# Patient Record
Sex: Male | Born: 1955 | Race: White | Hispanic: No | Marital: Married | Smoking: Never smoker
Health system: Southern US, Community
[De-identification: ages and names within clinical notes are randomized; demographics above are authoritative.]

## PROBLEM LIST (undated history)

## (undated) DIAGNOSIS — I517 Cardiomegaly: Secondary | ICD-10-CM

## (undated) DIAGNOSIS — I4891 Unspecified atrial fibrillation: Secondary | ICD-10-CM

## (undated) DIAGNOSIS — G4730 Sleep apnea, unspecified: Secondary | ICD-10-CM

## (undated) DIAGNOSIS — I10 Essential (primary) hypertension: Secondary | ICD-10-CM

## (undated) DIAGNOSIS — R55 Syncope and collapse: Secondary | ICD-10-CM

## (undated) HISTORY — DX: Syncope and collapse: R55

## (undated) HISTORY — PX: TONSILLECTOMY: SUR1361

## (undated) HISTORY — DX: Sleep apnea, unspecified: G47.30

## (undated) HISTORY — DX: Cardiomegaly: I51.7

## (undated) HISTORY — PX: BACK SURGERY: SHX140

## (undated) HISTORY — DX: Unspecified atrial fibrillation: I48.91

---

## 2009-10-22 ENCOUNTER — Emergency Department: Payer: Self-pay | Admitting: Emergency Medicine

## 2011-04-29 IMAGING — CR DG KNEE COMPLETE 4+V*R*
1 series · 4 of 4 positions shown · non-contrast
Comparison: none

REASON FOR EXAM: R knee trauma
COMMENTS:

PROCEDURE:     DXR - DXR KNEE RT COMP WITH OBLIQUES  - October 22, 2009 [DATE]
RESULT:     Images of the right knee show no definite fracture, dislocation
or radiopaque foreign body.

[Series 1: view not recorded · 0.17mm/px · 4 of 4 slices shown]
[im 1/4]
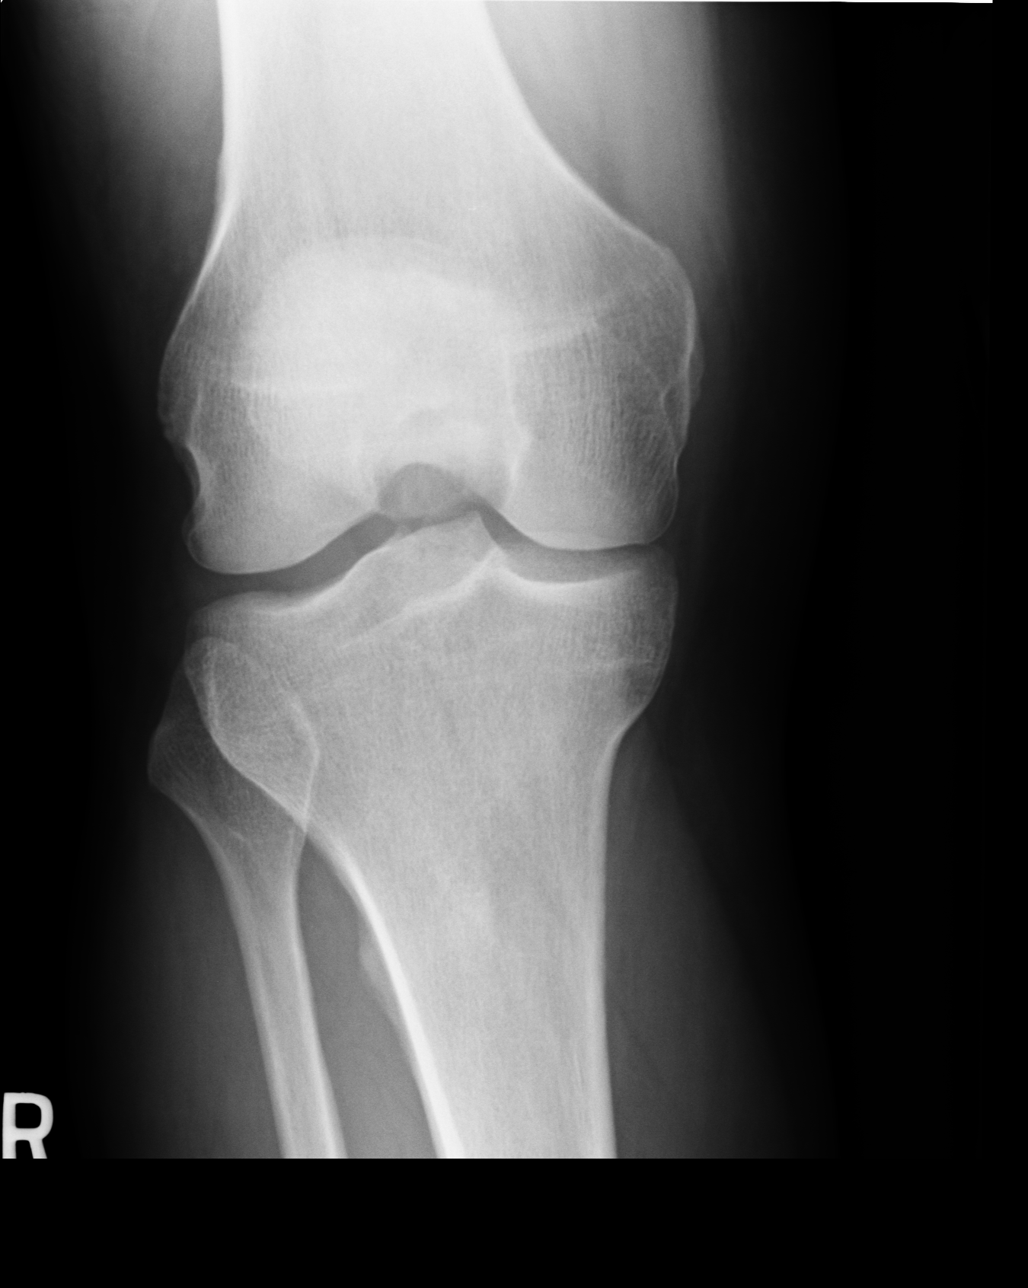
[im 2/4]
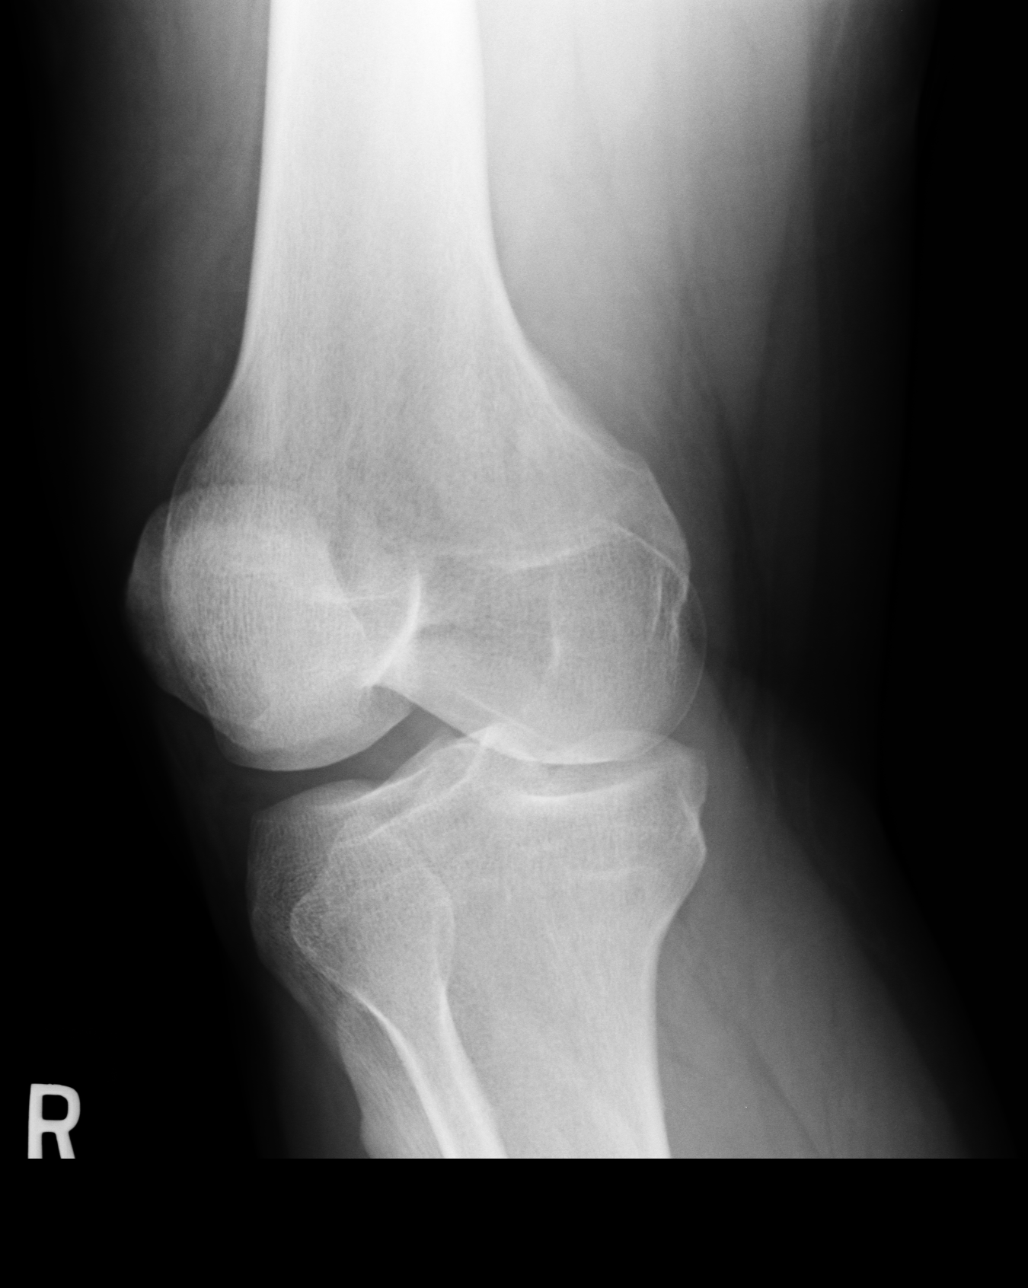
[im 3/4]
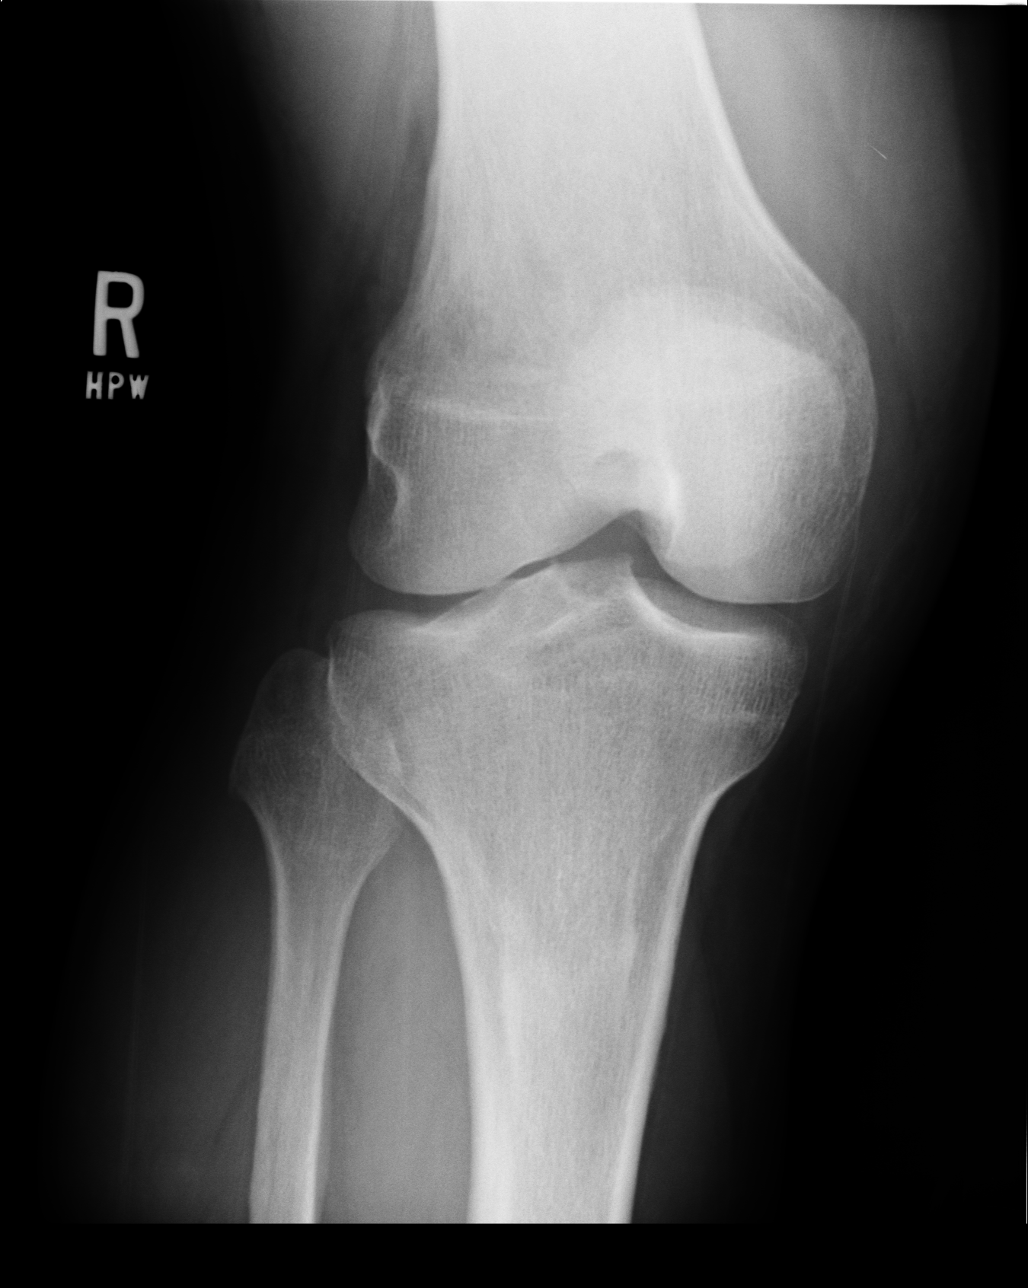
[im 4/4]
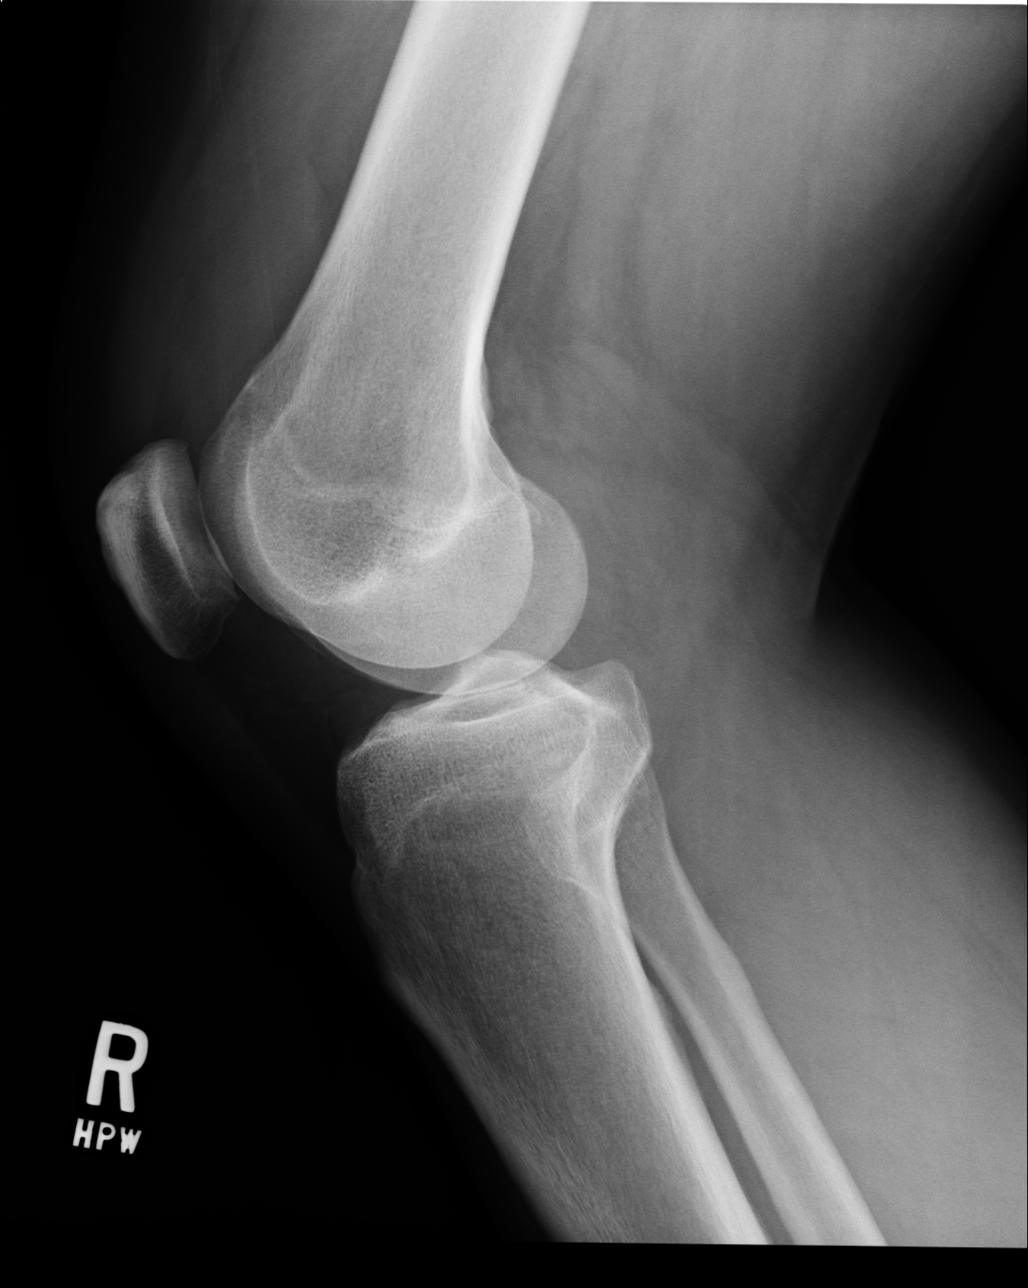

[4 of 4 positions shown; findings below may reference images not displayed]

IMPRESSION: Please see above.

## 2012-10-27 ENCOUNTER — Encounter: Payer: Self-pay | Admitting: Internal Medicine

## 2012-11-24 ENCOUNTER — Encounter: Payer: Self-pay | Admitting: Internal Medicine

## 2013-09-16 ENCOUNTER — Ambulatory Visit: Payer: Self-pay | Admitting: Family Medicine

## 2013-10-21 ENCOUNTER — Ambulatory Visit: Payer: Self-pay | Admitting: Family Medicine

## 2014-06-02 DIAGNOSIS — J302 Other seasonal allergic rhinitis: Secondary | ICD-10-CM | POA: Insufficient documentation

## 2015-03-24 ENCOUNTER — Encounter: Payer: Self-pay | Admitting: *Deleted

## 2015-03-27 ENCOUNTER — Ambulatory Visit
Admission: RE | Admit: 2015-03-27 | Discharge: 2015-03-27 | Disposition: A | Discharge status: Home / Self Care | Payer: BC Managed Care – PPO | Source: Ambulatory Visit | Attending: Gastroenterology | Admitting: Gastroenterology

## 2015-03-27 ENCOUNTER — Encounter
Admission: RE | Disposition: A | Discharge status: Home / Self Care | Payer: Self-pay | Source: Ambulatory Visit | Attending: Gastroenterology

## 2015-03-27 ENCOUNTER — Ambulatory Visit: Payer: BC Managed Care – PPO | Admitting: *Deleted

## 2015-03-27 DIAGNOSIS — E669 Obesity, unspecified: Secondary | ICD-10-CM | POA: Diagnosis not present

## 2015-03-27 DIAGNOSIS — D125 Benign neoplasm of sigmoid colon: Secondary | ICD-10-CM | POA: Insufficient documentation

## 2015-03-27 DIAGNOSIS — K621 Rectal polyp: Secondary | ICD-10-CM | POA: Insufficient documentation

## 2015-03-27 DIAGNOSIS — K5730 Diverticulosis of large intestine without perforation or abscess without bleeding: Secondary | ICD-10-CM | POA: Diagnosis not present

## 2015-03-27 DIAGNOSIS — G4730 Sleep apnea, unspecified: Secondary | ICD-10-CM | POA: Insufficient documentation

## 2015-03-27 DIAGNOSIS — Z1211 Encounter for screening for malignant neoplasm of colon: Secondary | ICD-10-CM | POA: Insufficient documentation

## 2015-03-27 DIAGNOSIS — Z79899 Other long term (current) drug therapy: Secondary | ICD-10-CM | POA: Insufficient documentation

## 2015-03-27 DIAGNOSIS — D120 Benign neoplasm of cecum: Secondary | ICD-10-CM | POA: Insufficient documentation

## 2015-03-27 DIAGNOSIS — Z6834 Body mass index (BMI) 34.0-34.9, adult: Secondary | ICD-10-CM | POA: Diagnosis not present

## 2015-03-27 DIAGNOSIS — I10 Essential (primary) hypertension: Secondary | ICD-10-CM | POA: Insufficient documentation

## 2015-03-27 HISTORY — PX: COLONOSCOPY WITH PROPOFOL: SHX5780

## 2015-03-27 HISTORY — DX: Essential (primary) hypertension: I10

## 2015-03-27 SURGERY — COLONOSCOPY WITH PROPOFOL
Anesthesia: General

## 2015-03-27 MED ORDER — LIDOCAINE HCL (CARDIAC) 20 MG/ML IV SOLN
INTRAVENOUS | Status: DC | PRN
  Administered 2015-03-27: 60 mg via INTRAVENOUS

## 2015-03-27 MED ORDER — PROPOFOL 500 MG/50ML IV EMUL
INTRAVENOUS | Status: DC | PRN
  Administered 2015-03-27: 130 ug/kg/min via INTRAVENOUS

## 2015-03-27 MED ORDER — SODIUM CHLORIDE 0.9 % IV SOLN
INTRAVENOUS | Status: DC
  Administered 2015-03-27: 1000 mL via INTRAVENOUS

## 2015-03-27 MED ORDER — PROPOFOL 10 MG/ML IV BOLUS
INTRAVENOUS | Status: DC | PRN
  Administered 2015-03-27: 40 mg via INTRAVENOUS

## 2015-03-27 NOTE — Discharge Instructions (Signed)

## 2015-03-27 NOTE — Transfer of Care (Signed)
Immediate Anesthesia Transfer of Care Note  Patient: Christopher Elliott.  Procedure(s) Performed: Procedure(s): COLONOSCOPY WITH PROPOFOL (N/A)  Patient Location: PACU  Anesthesia Type:General  Level of Consciousness: awake, alert , oriented and patient cooperative  Airway & Oxygen Therapy: Patient Spontanous Breathing and Patient connected to nasal cannula oxygen  Post-op Assessment: Report given to RN, Post -op Vital signs reviewed and stable and Patient moving all extremities X 4  Post vital signs: Reviewed and stable  Last Vitals:  Filed Vitals:   03/27/15 0741  Pulse: 76  Temp: 37 C  Resp: 20    Complications: No apparent anesthesia complications

## 2015-03-27 NOTE — Anesthesia Postprocedure Evaluation (Signed)
  Anesthesia Post-op Note  Patient: Christopher Elliott.  Procedure(s) Performed: Procedure(s): COLONOSCOPY WITH PROPOFOL (N/A)  Anesthesia type:General  Patient location: PACU  Post pain: Pain level controlled  Post assessment: Post-op Vital signs reviewed, Patient's Cardiovascular Status Stable, Respiratory Function Stable, Patent Airway and No signs of Nausea or vomiting  Post vital signs: Reviewed and stable  Last Vitals:  Filed Vitals:   03/27/15 0841  BP: 117/68  Pulse: 84  Temp: 36.1 C  Resp: 16    Level of consciousness: awake, alert  and patient cooperative  Complications: No apparent anesthesia complications

## 2015-03-27 NOTE — H&P (Signed)
  Primary Care Physician:  Marcello Fennel, MD  Pre-Procedure History & Physical: HPI:  Christopher Elliott. is a 59 y.o. male is here for an colonoscopy.   Past Medical History  Diagnosis Date  . Hypertension     Past Surgical History  Procedure Laterality Date  . Tonsillectomy    . Back surgery      Prior to Admission medications   Medication Sig Start Date End Date Taking? Authorizing Provider  amLODipine (NORVASC) 5 MG tablet Take 5 mg by mouth daily.   Yes Historical Provider, MD  losartan-hydrochlorothiazide (HYZAAR) 100-25 MG tablet Take 1 tablet by mouth daily.   Yes Historical Provider, MD  olmesartan-hydrochlorothiazide (BENICAR HCT) 40-25 MG tablet Take 1 tablet by mouth daily.    Historical Provider, MD    Allergies as of 02/14/2015  . (Not on File)    No family history on file.  Social History   Social History  . Marital Status: Married    Spouse Name: N/A  . Number of Children: N/A  . Years of Education: N/A   Occupational History  . Not on file.   Social History Main Topics  . Smoking status: Not on file  . Smokeless tobacco: Not on file  . Alcohol Use: Not on file  . Drug Use: Not on file  . Sexual Activity: Not on file   Other Topics Concern  . Not on file   Social History Narrative  . No narrative on file     Physical Exam: Pulse 76  Temp(Src) 98.6 F (37 C) (Oral)  Resp 20  Ht 5' 10.5" (1.791 m)  Wt 111.131 kg (245 lb)  BMI 34.65 kg/m2 General:   Alert,  pleasant and cooperative in NAD Head:  Normocephalic and atraumatic. Neck:  Supple; no masses or thyromegaly. Lungs:  Clear throughout to auscultation.    Heart:  Regular rate and rhythm. Abdomen:  Soft, nontender and nondistended. Normal bowel sounds, without guarding, and without rebound.   Neurologic:  Alert and  oriented x4;  grossly normal neurologically.  Impression/Plan: Kem Kays. is here for an colonoscopy to be performed for screening  Risks, benefits,  limitations, and alternatives regarding  colonoscopy have been reviewed with the patient.  Questions have been answered.  All parties agreeable.   Josefine Class, MD  03/27/2015, 8:09 AM

## 2015-03-27 NOTE — Op Note (Signed)
[facility] Gastroenterology Patient Name: Christopher Elliott Procedure Date: 03/27/2015 8:09 AM MRN: [id number] Account #: [id number] Date of Birth: Oct 22, 1955 Admit Type: Outpatient Age: 59 Room: [facility] ENDO ROOM 2 Gender: Male Note Status: Finalized Procedure:         Colonoscopy Indications:       Screening for colorectal malignant neoplasm, This is the                     patient's first colonoscopy Patient Profile:   This is a 59 year old male. Providers:         Gerrit Heck. Rayann Heman, MD Referring MD:      Caprice Renshaw (Referring MD) Medicines:         Propofol per Anesthesia Complications:     No immediate complications. Procedure:         Pre-Anesthesia Assessment:                    - Prior to the procedure, a History and Physical was                     performed, and patient medications, allergies and                     sensitivities were reviewed. The patient's tolerance of                     previous anesthesia was reviewed.                    After obtaining informed consent, the colonoscope was                     passed under direct vision. Throughout the procedure, the                     patient's blood pressure, pulse, and oxygen saturations                     were monitored continuously. The Colonoscope was                     introduced through the anus and advanced to the the                     terminal ileum. The colonoscopy was performed without                     difficulty. The patient tolerated the procedure well. The                     quality of the bowel preparation was excellent. Findings:      The perianal and digital rectal examinations were normal.      A 5 mm polyp was found in the cecum. The polyp was sessile. The polyp       was removed with a cold snare. Resection and retrieval were complete.      Four sessile polyps were found in the rectum and in the sigmoid colon.       The polyps were 2 to 3 mm in size. These polyps were  removed with a       jumbo cold forceps. Resection and retrieval were complete.      A few small-mouthed diverticula were found in the sigmoid colon.  The exam was otherwise without abnormality on direct and retroflexion       views. Impression:        - One 5 mm polyp in the cecum. Resected and retrieved.                    - Four 2 to 3 mm polyps in the rectum and in the sigmoid                     colon. Resected and retrieved.                    - Diverticulosis in the sigmoid colon.                    - The examination was otherwise normal on direct and                     retroflexion views. Recommendation:    - Observe patient in GI recovery unit.                    - High fiber diet.                    - Continue present medications.                    - Await pathology results.                    - Repeat colonoscopy for surveillance based on pathology                     results.                    - Return to referring physician.                    - The findings and recommendations were discussed with the                     patient.                    - The findings and recommendations were discussed with the                     patient's family. Procedure Code(s): --- Professional ---                    [phone], Colonoscopy, flexible; with removal of tumor(s),                     polyp(s), or other lesion(s) by snare technique                    60454, 39, Colonoscopy, flexible; with biopsy, single or                     multiple CPT copyright 2014 American Medical Association. All rights reserved. The codes documented in this report are preliminary and upon coder review may  be revised to meet current compliance requirements. Mellody Life, MD 03/27/2015 8:37:17 AM This report has been signed electronically. Number of Addenda: 0 Note Initiated On: 03/27/2015 8:09 AM Scope Withdrawal Time: 0 hours 13 minutes 37 seconds  Total Procedure Duration: 0 hours 16 minutes  21 seconds  [facility]

## 2015-03-27 NOTE — Anesthesia Preprocedure Evaluation (Signed)
Anesthesia Evaluation  Patient identified by MRN, date of birth, ID band Patient awake    Reviewed: Allergy & Precautions, NPO status , Patient's Chart, lab work & pertinent test results  Airway Mallampati: III  TM Distance: >3 FB Neck ROM: Limited    Dental  (+) Teeth Intact   Pulmonary sleep apnea and Continuous Positive Airway Pressure Ventilation ,    Pulmonary exam normal        Cardiovascular Exercise Tolerance: Good hypertension, Pt. on medications Normal cardiovascular exam     Neuro/Psych    GI/Hepatic negative GI ROS,   Endo/Other  negative endocrine ROS  Renal/GU      Musculoskeletal   Abdominal (+) + obese,   Peds  Hematology   Anesthesia Other Findings   Reproductive/Obstetrics                             Anesthesia Physical Anesthesia Plan  ASA: III  Anesthesia Plan: General   Post-op Pain Management:    Induction: Intravenous  Airway Management Planned: Nasal Cannula  Additional Equipment:   Intra-op Plan:   Post-operative Plan:   Informed Consent: I have reviewed the patients History and Physical, chart, labs and discussed the procedure including the risks, benefits and alternatives for the proposed anesthesia with the patient or authorized representative who has indicated his/her understanding and acceptance.     Plan Discussed with: CRNA  Anesthesia Plan Comments:         Anesthesia Quick Evaluation

## 2015-03-28 ENCOUNTER — Encounter: Payer: Self-pay | Admitting: Gastroenterology

## 2015-03-28 LAB — SURGICAL PATHOLOGY

## 2018-02-12 ENCOUNTER — Encounter (INDEPENDENT_AMBULATORY_CARE_PROVIDER_SITE_OTHER): Payer: Self-pay

## 2018-02-12 ENCOUNTER — Ambulatory Visit: Payer: BC Managed Care – PPO | Admitting: Family Medicine

## 2018-02-12 ENCOUNTER — Encounter: Payer: Self-pay | Admitting: Family Medicine

## 2018-02-12 VITALS — BP 110/70 | HR 69 | Temp 97.8°F | Ht 68.75 in | Wt 243.0 lb

## 2018-02-12 DIAGNOSIS — R55 Syncope and collapse: Secondary | ICD-10-CM | POA: Diagnosis not present

## 2018-02-12 DIAGNOSIS — I10 Essential (primary) hypertension: Secondary | ICD-10-CM | POA: Diagnosis not present

## 2018-02-12 DIAGNOSIS — I517 Cardiomegaly: Secondary | ICD-10-CM

## 2018-02-12 DIAGNOSIS — Z23 Encounter for immunization: Secondary | ICD-10-CM | POA: Diagnosis not present

## 2018-02-12 DIAGNOSIS — Z0000 Encounter for general adult medical examination without abnormal findings: Secondary | ICD-10-CM

## 2018-02-12 MED ORDER — OLMESARTAN MEDOXOMIL-HCTZ 40-25 MG PO TABS: 0.5000 | ORAL_TABLET | Freq: Every day | ORAL | Status: DC

## 2018-02-12 NOTE — Progress Notes (Signed)
New patient.   Hypertension:    Using medication without problems or lightheadedness:  See below.  Chest pain with exertion:no Edema:no Short of breath:no Weight up was up to 260 and then he made sig change in diet and exercise.     Nosebleed this AM, single event.  No other bleeding.    Syncope about 1 year ago.  He got up and then passed out.   Then another episode in the meantime, brief episode with standing.  He was out in the heat when it happened.  3 episodes total in the lifetime, all in the last few years.  He can occ get lightheaded.   Echo recently done NORMAL LEFT VENTRICULAR SYSTOLIC FUNCTION WITH MODERATE LVH NORMAL LA PRESSURES WITH NORMAL DIASTOLIC FUNCTION NORMAL RIGHT VENTRICULAR SYSTOLIC FUNCTION VALVULAR REGURGITATION: TRIVIAL MR, TRIVIAL TR NO VALVULAR STENOSIS NO PRIOR STUDY FOR COMPARISON  No h/o SZ activity.    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   Advance directive d/w pt.  Wife designated if patient were incapacitated.    PMH and SH reviewed  ROS: Per HPI unless specifically indicated in ROS section   Meds, vitals, and allergies reviewed.   GEN: nad, alert and oriented HEENT: mucous membranes moist NECK: supple w/o LA CV: rrr.  no murmur PULM: ctab, no inc wob ABD: soft, +bs EXT: no edema SKIN: no acute rash

## 2018-02-12 NOTE — Patient Instructions (Addendum)
Take tylenol instead of aleve or ibuprofen if needed.  Cut the BP medicine in half and update me about your BP in about 10 days.   Keep working on diet and exercise.   Recheck labs in about 2-3 months.  Fasting lab visit.  Take care.  Glad to see you.  Thanks for getting a flu shot.  I'll check back on your old records in the meantime.

## 2018-02-15 ENCOUNTER — Encounter: Payer: Self-pay | Admitting: Family Medicine

## 2018-02-15 ENCOUNTER — Telehealth: Payer: Self-pay | Admitting: Family Medicine

## 2018-02-15 DIAGNOSIS — Z114 Encounter for screening for human immunodeficiency virus [HIV]: Secondary | ICD-10-CM

## 2018-02-15 DIAGNOSIS — R55 Syncope and collapse: Secondary | ICD-10-CM | POA: Insufficient documentation

## 2018-02-15 DIAGNOSIS — Z1159 Encounter for screening for other viral diseases: Secondary | ICD-10-CM

## 2018-02-15 DIAGNOSIS — I10 Essential (primary) hypertension: Secondary | ICD-10-CM | POA: Insufficient documentation

## 2018-02-15 DIAGNOSIS — Z0000 Encounter for general adult medical examination without abnormal findings: Secondary | ICD-10-CM | POA: Insufficient documentation

## 2018-02-15 DIAGNOSIS — I517 Cardiomegaly: Secondary | ICD-10-CM | POA: Insufficient documentation

## 2018-02-15 NOTE — Assessment & Plan Note (Signed)
See syncope discussion.  Would cut olmesartan hydrochlorothiazide in half given the significant weight reduction and diet changes noted.  Recheck labs in a few months.  Echo discussed with patient.  He agrees with plan.  At this point goal is to avoid hypotension.  >30 minutes spent in face to face time with patient, >50% spent in counselling or coordination of care

## 2018-02-15 NOTE — Assessment & Plan Note (Signed)
Will review old records.  See follow-up phone note.

## 2018-02-15 NOTE — Assessment & Plan Note (Signed)
LVH noted, moderate.  Discussed with patient about adequate blood pressure control.  He is working on weight reduction.  Continue healthy diet and exercise.

## 2018-02-15 NOTE — Telephone Encounter (Signed)
Call patient.  Old records reviewed.  I do not see recent tetanus shot.  Would be reasonable to get done at some point, unless he knows of a dose in the last 10 years.  I do not see where he has had hepatitis C or HIV screening done in the past.  I do not think we talked about that at the office visit.  If he wants to get either done then we can add it on to his follow-up labs.  Let me know if I need to add that on.     I looked back at his colonoscopy report from 2016 and he had an adenomatous polyp so I believe he would need follow-up colonoscopy in 5 years, not 10.  EMR updated.  Keep working on diet and exercise and let me know how he feels and how his blood pressure is running on the lower dose of olmesartan hydrochlorothiazide as we discussed.  Thanks.

## 2018-02-15 NOTE — Assessment & Plan Note (Signed)
Has seen cardiology, has prodrome of symptoms.  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  When he has an event, it sounds like he has orthostatic symptoms contributing to the event.  He is working on diet and exercise.  Cut olmesartan hydrochlorothiazide in half as the goal is to avoid hypotension.  Per cardiology report, it was not thought that he had an arrhythmic issue.  He is never had a seizure before.  He agrees with plan.

## 2018-02-16 NOTE — Telephone Encounter (Signed)
Noted. Thanks.  HIV and HCV labs ordered.

## 2018-02-16 NOTE — Addendum Note (Signed)
Addended by: Tonia Ghent on: 02/16/2018 09:50 PM   Modules accepted: Orders

## 2018-02-16 NOTE — Telephone Encounter (Signed)
Patient notified as instructed by telephone and verbalized understanding. Patient stated that he does not remember getting a tetanus in the last 10 years and can get that in the future. Patient stated that he would like to have the hepatitis C and HIV lab work ordered with his next labs. Patient stated that he appreciated your good care at his office visit and your follow-up with him.

## 2018-04-28 ENCOUNTER — Other Ambulatory Visit: Payer: BC Managed Care – PPO

## 2018-09-10 ENCOUNTER — Telehealth: Payer: Self-pay

## 2018-09-10 NOTE — Telephone Encounter (Signed)
Tried to call pt. We do not have losartan on his list. We have olmesartan-hctz. I need to clarify what he is taking before we do a refill.

## 2018-09-10 NOTE — Telephone Encounter (Signed)
Pt called back and scheduled virtual visit on 09/10/18 with Dr Damita Dunnings. Pt last seen 01/2018. See appt notes for additional info.

## 2018-09-10 NOTE — Telephone Encounter (Signed)
[facility] Patient Name: Christopher Elliott Gender: Male DOB: [birth year] Age: 63 Y 9 M 22 D Return Phone Number: [phone] (Primary) Address: City/State/Zip: [place] [address] Client [facility] Night - Client Client Site [place] Physician [facility] - PHYSICIAN, NOT LISTED- MD Contact Type Call Who Is Calling Patient / Member / Family / Caregiver Call Type Triage / Clinical Relationship To Patient Self Return Phone Number [phone] (Primary) Chief Complaint Prescription Refill or Medication Request (non symptomatic) Reason for Call Medication Question / Request Initial Comment Pharm has not heard back from office about his refill. Dr Elsie Stain Translation No Nurse Assessment Nurse: Garnetta Buddy, RN, Cyndi Date/Time Eilene Ghazi Time): 09/09/2018 6:07:32 PM Confirm and document reason for call. If symptomatic, describe symptoms. ---Caller states he has been trying to get refill on his BP medication, Losartan, for 2 weeks. He states he called the pharmacy again today and patient was informed that doctor has not authorized refill "for whatever reason". Caller states he still has a few pills left. Caller reports no new or worsening symptoms. Caller advised to call doctor's office back in the morning during regular business hours per medication directives. Has the patient had close contact with a person known or suspected to have the novel coronavirus illness OR traveled / lives in area with major community spread (including international travel) in the last 14 days from the onset of symptoms? * If Asymptomatic, screen for exposure and travel within the last 14 days. ---Not Applicable Does the patient have any new or worsening symptoms? ---No Guidelines Guideline Title Affirmed Question Affirmed Notes Nurse Date/Time (Eastern Time) Disp. Time  Eilene Ghazi Time) Disposition Final User 09/09/2018 6:10:32 PM Clinical Call Yes Garnetta Buddy, RN, Cyndi

## 2018-09-11 ENCOUNTER — Ambulatory Visit (INDEPENDENT_AMBULATORY_CARE_PROVIDER_SITE_OTHER): Payer: BC Managed Care – PPO | Admitting: Family Medicine

## 2018-09-11 DIAGNOSIS — I10 Essential (primary) hypertension: Secondary | ICD-10-CM

## 2018-09-11 MED ORDER — OLMESARTAN MEDOXOMIL-HCTZ 40-25 MG PO TABS: 0.5000 | ORAL_TABLET | Freq: Every day | ORAL | 3 refills | Status: DC

## 2018-09-11 NOTE — Telephone Encounter (Signed)
See OV note from today.

## 2018-09-11 NOTE — Progress Notes (Signed)
Interactive audio and video telecommunications were attempted between this provider and patient, however failed, due to patient having technical difficulties OR patient did not have access to video capability.  We continued and completed visit with audio only.   Virtual Visit via Telephone Note  I connected with patient on 09/11/18 at 11:50 AM by telephone and verified that I am speaking with the correct person using two identifiers.  Location of patient: at work  Location of MD: [facility] Name of referring provider (if blank then none associated): Names per persons and role in encounter:  MD: Earlyne Iba, Patient: name listed above.    I discussed the limitations, risks, security and privacy concerns of performing an evaluation and management service by telephone and the availability of in person appointments. I also discussed with the patient that there may be a patient responsible charge related to this service. The patient expressed understanding and agreed to proceed.  History of Present Illness:   Hypertension:    Using medication without problems or lightheadedness: yes Chest pain with exertion:no Edema:no Short of breath:no Average home BPs: not checked but encouraged, with him to update me if consistently >140/>90. Needs refill, done at visit.    Observations/Objective: nad Normal speech.    Assessment and Plan: HTN.  No emergent issues noted.  He'll check BP and update me if needed.  He'll come in for fasting lab visit 12:45 on 09/16/2018. Continue work on diet and exercise, d/w pt.  rx sent, continue as is for now.  He agrees with plan.   Follow Up Instructions: as above.    I discussed the assessment and treatment plan with the patient. The patient was provided an opportunity to ask questions and all were answered. The patient agreed with the plan and demonstrated an understanding of the instructions.   The patient was advised to call back or seek an  in-person evaluation if the symptoms worsen or if the condition fails to improve as anticipated.  I provided 7 minutes of non-face-to-face time during this encounter.  Elsie Stain, MD

## 2018-09-13 NOTE — Assessment & Plan Note (Signed)
No emergent issues noted.  He'll check BP and update me if needed.  He'll come in for fasting lab visit 12:45 on 09/16/2018. Continue work on diet and exercise, d/w pt.  rx sent, continue as is for now.  He agrees with plan.

## 2018-09-16 ENCOUNTER — Other Ambulatory Visit (INDEPENDENT_AMBULATORY_CARE_PROVIDER_SITE_OTHER): Payer: Self-pay

## 2018-09-16 DIAGNOSIS — Z1159 Encounter for screening for other viral diseases: Secondary | ICD-10-CM

## 2018-09-16 DIAGNOSIS — I10 Essential (primary) hypertension: Secondary | ICD-10-CM

## 2018-09-16 DIAGNOSIS — Z114 Encounter for screening for human immunodeficiency virus [HIV]: Secondary | ICD-10-CM

## 2018-09-16 LAB — COMPREHENSIVE METABOLIC PANEL
ALT: 25 U/L (ref 0–53)
AST: 19 U/L (ref 0–37)
Albumin: 4 g/dL (ref 3.5–5.2)
Alkaline Phosphatase: 61 U/L (ref 39–117)
BUN: 21 mg/dL (ref 6–23)
CO2: 26 mEq/L (ref 19–32)
Calcium: 9.2 mg/dL (ref 8.4–10.5)
Chloride: 102 mEq/L (ref 96–112)
Creatinine, Ser: 1.09 mg/dL (ref 0.40–1.50)
GFR: 67.93 mL/min (ref >60.00)
Glucose, Bld: 100 mg/dL — ABNORMAL HIGH (ref 70–99)
Potassium: 4 mEq/L (ref 3.5–5.1)
Sodium: 136 mEq/L (ref 135–145)
Total Bilirubin: 0.5 mg/dL (ref 0.2–1.2)
Total Protein: 7.2 g/dL (ref 6.0–8.3)

## 2018-09-16 LAB — LIPID PANEL
Cholesterol: 173 mg/dL (ref 0–200)
HDL: 38.2 mg/dL — ABNORMAL LOW (ref >39.00)
LDL Cholesterol: 109 mg/dL — ABNORMAL HIGH (ref 0–99)
NonHDL: 134.66
Total CHOL/HDL Ratio: 5
Triglycerides: 130 mg/dL (ref 0.0–149.0)
VLDL: 26 mg/dL (ref 0.0–40.0)

## 2018-09-17 LAB — HEPATITIS C ANTIBODY
Hepatitis C Ab: NONREACTIVE
SIGNAL TO CUT-OFF: 0.03 (ref <1.00)

## 2018-09-17 LAB — HIV ANTIBODY (ROUTINE TESTING W REFLEX): HIV 1&2 Ab, 4th Generation: NONREACTIVE

## 2018-09-21 ENCOUNTER — Encounter: Payer: Self-pay | Admitting: *Deleted

## 2019-06-16 ENCOUNTER — Telehealth: Payer: Self-pay

## 2019-06-16 NOTE — Telephone Encounter (Addendum)
Pt left v/m; pt requested cb about covid vaccine. I called pt and got v/m  And left v/m for pt to cb. Pt called back;  I advised pt of website [email or web address] to request the covid vaccine. Pt said he tried the [facility] and was advised they are giving to pts 66 Yrs old and older.  Pt will try the website and will cb if needed.

## 2019-10-13 ENCOUNTER — Other Ambulatory Visit: Payer: Self-pay

## 2019-10-13 ENCOUNTER — Telehealth: Payer: Self-pay

## 2019-10-13 MED ORDER — OLMESARTAN MEDOXOMIL-HCTZ 40-25 MG PO TABS: 0.5000 | ORAL_TABLET | Freq: Every day | ORAL | 0 refills | Status: DC

## 2019-10-13 NOTE — Telephone Encounter (Signed)
Pt left v/m requesting refill olmesartan-HCTZ; pt said pharmacy is waiting on refill response. Per DPR left v/m for pt will refill olmesartan-HCTZ 40-25 mg # 45 x 0 and requested pt to call for appt with Dr Damita Dunnings before med runs out; pt last seen 08/2018.  Refill done to [facility] [address].

## 2019-10-22 ENCOUNTER — Ambulatory Visit: Payer: Self-pay | Admitting: Family Medicine

## 2019-11-09 ENCOUNTER — Ambulatory Visit (INDEPENDENT_AMBULATORY_CARE_PROVIDER_SITE_OTHER): Payer: Medicare PPO | Admitting: Family Medicine

## 2019-11-09 ENCOUNTER — Encounter: Payer: Self-pay | Admitting: Family Medicine

## 2019-11-09 ENCOUNTER — Other Ambulatory Visit: Payer: Self-pay

## 2019-11-09 VITALS — BP 146/82 | HR 95 | Temp 97.8°F | Ht 69.0 in | Wt 261.1 lb

## 2019-11-09 DIAGNOSIS — Z7189 Other specified counseling: Secondary | ICD-10-CM

## 2019-11-09 DIAGNOSIS — Z0000 Encounter for general adult medical examination without abnormal findings: Secondary | ICD-10-CM

## 2019-11-09 DIAGNOSIS — R55 Syncope and collapse: Secondary | ICD-10-CM

## 2019-11-09 DIAGNOSIS — I10 Essential (primary) hypertension: Secondary | ICD-10-CM

## 2019-11-09 DIAGNOSIS — M533 Sacrococcygeal disorders, not elsewhere classified: Secondary | ICD-10-CM

## 2019-11-09 DIAGNOSIS — G4733 Obstructive sleep apnea (adult) (pediatric): Secondary | ICD-10-CM

## 2019-11-09 MED ORDER — OLMESARTAN MEDOXOMIL-HCTZ 40-25 MG PO TABS: 0.5000 | ORAL_TABLET | Freq: Every day | ORAL | 3 refills | Status: DC

## 2019-11-09 NOTE — Patient Instructions (Addendum)
Tdap/tetanus may be cheaper at the pharmacy.   Check with your insurance to see if they will cover the shingles and tetanus shot. Check on coverage for PNA 23/pneumovax.   Let me know if you have trouble getting scheduled for a colonoscopy this fall.    Go to the lab on the way out.   If you have mychart we'll likely use that to update you.    We'll call about seeing cardiology.  Keep using your CPAP.  Take care.  Glad to see you.

## 2019-11-09 NOTE — Progress Notes (Signed)
This visit occurred during the SARS-CoV-2 public health emergency.  Safety protocols were in place, including screening questions prior to the visit, additional usage of staff PPE, and extensive cleaning of exam room while observing appropriate contact time as indicated for disinfecting solutions.  His mother is in ALF with CHF.  D/w pt.    Hypertension:   LVH prev noted on echo.  D/w pt about BP control. Using medication without problems or lightheadedness: see below Chest pain with exertion:no Edema:no Short of breath:no benicar HCTZ.   Using CPAP nightly for OSA with relief.  Compliant.  Sleeping better with use.   He has two episodes of syncope in the last year.  With both episodes (on separate days) he was sitting down, stood up, then passed out.  No injury.  Came to shortly thereafter.  No h/o SZ, no CP with the events.  He had prev cardiac eval for syncope years ago.    He had some brief episodes of R jaw swelling that quickly resolved. No lip or tongue swelling.  This appears to be a separate issue.  No symptoms currently.  This could have been due to a salivary stone that subsequently resolved.  He some tailbone pain on sitting.  Midline pain.  He does a lot of driving and a lot of sitting.  He thought that could be related.  He had his covid vaccine.  D/w pt.  Flu d/w pt. Tetanus shot may be cheaper at pharmacy.   shingrix d/w pt.   PNA d/w pt.  See avs.   Wife designated if patient were incapacitated.   HIV and HCV prev done.   Nonsmoker, not due for AAA screening.   Colonoscopy 2016, d/w pt about 5 year f/u.   Prostate cancer screening and PSA options (with potential risks and benefits of testing vs not testing) were discussed along with recent recs/guidelines.  He declined testing PSA at this point.  Meds, vitals, and allergies reviewed.   PMH and SH reviewed  ROS: Per HPI unless specifically indicated in ROS section   GEN: nad, alert and oriented HEENT: ncat NECK:  supple w/o LA CV: rrr. PULM: ctab, no inc wob ABD: soft, +bs EXT: no edema SKIN: no acute rash  At least 30 minutes were devoted to patient care in this encounter (this can potentially include time spent reviewing the patient's file/history, interviewing and examining the patient, counseling/reviewing plan with patient, ordering referrals, ordering tests, reviewing relevant laboratory or x-ray data, and documenting the encounter).

## 2019-11-10 LAB — COMPREHENSIVE METABOLIC PANEL
ALT: 30 U/L (ref 0–53)
AST: 24 U/L (ref 0–37)
Albumin: 4.2 g/dL (ref 3.5–5.2)
Alkaline Phosphatase: 63 U/L (ref 39–117)
BUN: 22 mg/dL (ref 6–23)
CO2: 27 mEq/L (ref 19–32)
Calcium: 9.3 mg/dL (ref 8.4–10.5)
Chloride: 103 mEq/L (ref 96–112)
Creatinine, Ser: 1.1 mg/dL (ref 0.40–1.50)
GFR: 66.98 mL/min (ref >60.00)
Glucose, Bld: 90 mg/dL (ref 70–99)
Potassium: 4.2 mEq/L (ref 3.5–5.1)
Sodium: 139 mEq/L (ref 135–145)
Total Bilirubin: 0.5 mg/dL (ref 0.2–1.2)
Total Protein: 7.1 g/dL (ref 6.0–8.3)

## 2019-11-10 LAB — CBC WITH DIFFERENTIAL/PLATELET
Basophils Absolute: 0.1 10*3/uL (ref 0.0–0.1)
Basophils Relative: 1.1 % (ref 0.0–3.0)
Eosinophils Absolute: 0.2 10*3/uL (ref 0.0–0.7)
Eosinophils Relative: 2.1 % (ref 0.0–5.0)
HCT: 43.2 % (ref 39.0–52.0)
Hemoglobin: 14.6 g/dL (ref 13.0–17.0)
Lymphocytes Relative: 25.7 % (ref 12.0–46.0)
Lymphs Abs: 2.3 10*3/uL (ref 0.7–4.0)
MCHC: 33.9 g/dL (ref 30.0–36.0)
MCV: 90.6 fl (ref 78.0–100.0)
Monocytes Absolute: 0.8 10*3/uL (ref 0.1–1.0)
Monocytes Relative: 9.1 % (ref 3.0–12.0)
Neutro Abs: 5.6 10*3/uL (ref 1.4–7.7)
Neutrophils Relative %: 62 % (ref 43.0–77.0)
Platelets: 235 10*3/uL (ref 150.0–400.0)
RBC: 4.76 Mil/uL (ref 4.22–5.81)
RDW: 14.8 % (ref 11.5–15.5)
WBC: 9.1 10*3/uL (ref 4.0–10.5)

## 2019-11-10 LAB — LIPID PANEL
Cholesterol: 166 mg/dL (ref 0–200)
HDL: 39.8 mg/dL (ref >39.00)
LDL Cholesterol: 103 mg/dL — ABNORMAL HIGH (ref 0–99)
NonHDL: 126.44
Total CHOL/HDL Ratio: 4
Triglycerides: 115 mg/dL (ref 0.0–149.0)
VLDL: 23 mg/dL (ref 0.0–40.0)

## 2019-11-10 LAB — TSH: TSH: 1.79 u[IU]/mL (ref 0.35–4.50)

## 2019-11-14 DIAGNOSIS — G4733 Obstructive sleep apnea (adult) (pediatric): Secondary | ICD-10-CM | POA: Insufficient documentation

## 2019-11-14 DIAGNOSIS — M533 Sacrococcygeal disorders, not elsewhere classified: Secondary | ICD-10-CM | POA: Insufficient documentation

## 2019-11-14 DIAGNOSIS — Z7189 Other specified counseling: Secondary | ICD-10-CM | POA: Insufficient documentation

## 2019-11-14 NOTE — Assessment & Plan Note (Signed)
Refer prior to cardiology.  No change in meds at this point.

## 2019-11-14 NOTE — Assessment & Plan Note (Signed)
He had his covid vaccine.  D/w pt.  Flu d/w pt. Tetanus shot may be cheaper at pharmacy.   shingrix d/w pt.   PNA d/w pt.  See avs.   Wife designated if patient were incapacitated.   HIV and HCV prev done.   Nonsmoker, not due for AAA screening.   Colonoscopy 2016, d/w pt about 5 year f/u.   Prostate cancer screening and PSA options (with potential risks and benefits of testing vs not testing) were discussed along with recent recs/guidelines.  He declined testing PSA at this point.

## 2019-11-14 NOTE — Assessment & Plan Note (Signed)
Likely related to prolonged sitting, discussed.  He can update me if worse.

## 2019-11-14 NOTE — Assessment & Plan Note (Signed)
  Using CPAP nightly for OSA with relief.  Compliant.  Sleeping better with use.  Continue as is.

## 2019-11-14 NOTE — Assessment & Plan Note (Signed)
See notes on EKG.  Refer back to cardiology.  At this point still okay for outpatient follow-up.  His blood pressure is not low and is not orthostatic so we did not change his medications at this point.

## 2019-11-14 NOTE — Assessment & Plan Note (Signed)
Wife designated if patient were incapacitated.  

## 2019-11-15 ENCOUNTER — Telehealth: Payer: Self-pay

## 2019-11-15 NOTE — Telephone Encounter (Signed)
Patient aware of results and recommendations. °

## 2019-11-15 NOTE — Telephone Encounter (Signed)
-----   Message from Tonia Ghent, MD sent at 11/14/2019 10:16 PM EDT ----- Please notify patient.  Sugar kidney and liver tests are fine.  His lipids are reasonable for now, his thyroid test is normal and his blood counts are fine.  Be careful upon standing to make sure he does not get lightheaded and I want him to follow-up with cardiology.  Already put in the referral.  If he has changes in the meantime then please let us know.  Thanks.

## 2020-01-31 DIAGNOSIS — I4891 Unspecified atrial fibrillation: Secondary | ICD-10-CM | POA: Diagnosis not present

## 2020-01-31 DIAGNOSIS — R0602 Shortness of breath: Secondary | ICD-10-CM | POA: Diagnosis not present

## 2020-02-03 DIAGNOSIS — I10 Essential (primary) hypertension: Secondary | ICD-10-CM | POA: Diagnosis not present

## 2020-02-03 DIAGNOSIS — I480 Paroxysmal atrial fibrillation: Secondary | ICD-10-CM | POA: Diagnosis not present

## 2020-02-11 DIAGNOSIS — Z7901 Long term (current) use of anticoagulants: Secondary | ICD-10-CM | POA: Diagnosis not present

## 2020-02-11 DIAGNOSIS — G4733 Obstructive sleep apnea (adult) (pediatric): Secondary | ICD-10-CM | POA: Diagnosis not present

## 2020-02-11 DIAGNOSIS — Z7722 Contact with and (suspected) exposure to environmental tobacco smoke (acute) (chronic): Secondary | ICD-10-CM | POA: Diagnosis not present

## 2020-02-11 DIAGNOSIS — Z809 Family history of malignant neoplasm, unspecified: Secondary | ICD-10-CM | POA: Diagnosis not present

## 2020-02-11 DIAGNOSIS — I4891 Unspecified atrial fibrillation: Secondary | ICD-10-CM | POA: Diagnosis not present

## 2020-04-17 ENCOUNTER — Other Ambulatory Visit: Payer: Self-pay | Admitting: Family Medicine

## 2020-04-17 NOTE — Telephone Encounter (Signed)
Pharmacy requests refill on: Olmesartan Medox/HCTZ 40-25 mg   LAST REFILL: 01/20/2020 LAST OV: 11/09/2019 NEXT OV: Not Scheduled PHARMACY: [facility] [place], [place]

## 2020-05-01 DIAGNOSIS — G4733 Obstructive sleep apnea (adult) (pediatric): Secondary | ICD-10-CM | POA: Diagnosis not present

## 2020-06-08 DIAGNOSIS — I480 Paroxysmal atrial fibrillation: Secondary | ICD-10-CM | POA: Diagnosis not present

## 2020-06-08 DIAGNOSIS — I10 Essential (primary) hypertension: Secondary | ICD-10-CM | POA: Diagnosis not present

## 2020-06-08 DIAGNOSIS — Z7901 Long term (current) use of anticoagulants: Secondary | ICD-10-CM | POA: Diagnosis not present

## 2020-06-22 DIAGNOSIS — J209 Acute bronchitis, unspecified: Secondary | ICD-10-CM | POA: Diagnosis not present

## 2020-06-22 DIAGNOSIS — R059 Cough, unspecified: Secondary | ICD-10-CM | POA: Diagnosis not present

## 2020-08-01 DIAGNOSIS — Z7901 Long term (current) use of anticoagulants: Secondary | ICD-10-CM | POA: Diagnosis not present

## 2020-08-01 DIAGNOSIS — I10 Essential (primary) hypertension: Secondary | ICD-10-CM | POA: Diagnosis not present

## 2020-08-01 DIAGNOSIS — Z8249 Family history of ischemic heart disease and other diseases of the circulatory system: Secondary | ICD-10-CM | POA: Diagnosis not present

## 2020-08-01 DIAGNOSIS — I739 Peripheral vascular disease, unspecified: Secondary | ICD-10-CM | POA: Diagnosis not present

## 2020-10-02 ENCOUNTER — Telehealth: Payer: Self-pay

## 2020-10-02 NOTE — Telephone Encounter (Signed)
Patient needs AWV phone visit in June prior to his CPE visit on 11/10/20 with Dr Damita Dunnings. There were no openings yet for June for your schedule. Please call patient to schedule or let me know when I can schedule patient. His wife also needs same thing and I am sending a note on her to you also. Thank you!

## 2020-10-03 NOTE — Telephone Encounter (Signed)
Routing to [employer] for [employer], [employer].

## 2020-10-10 ENCOUNTER — Ambulatory Visit: Payer: Medicare PPO

## 2020-10-13 ENCOUNTER — Other Ambulatory Visit: Payer: Self-pay

## 2020-10-13 ENCOUNTER — Ambulatory Visit (INDEPENDENT_AMBULATORY_CARE_PROVIDER_SITE_OTHER): Payer: Medicare PPO

## 2020-10-13 DIAGNOSIS — Z0000 Encounter for general adult medical examination without abnormal findings: Secondary | ICD-10-CM

## 2020-10-13 NOTE — Progress Notes (Signed)
PCP notes:  Health Maintenance:  Prevnar 13, Shingrix and Covid- Up to date per patient. States he received at pharmacy and will bring the dates to his physical next month so we can document in chart  Colonoscopy- due   Abnormal Screenings: none   Patient concerns: none   Nurse concerns: none   Next PCP appt.: 11/10/2020 @ 12 pm

## 2020-10-13 NOTE — Patient Instructions (Signed)
Christopher Elliott , Thank you for taking time to come for your Medicare Wellness Visit. I appreciate your ongoing commitment to your health goals. Please review the following plan we discussed and let me know if I can assist you in the future.   Screening recommendations/referrals: Colonoscopy: due, will discuss with provider at physical Recommended yearly ophthalmology/optometry visit for glaucoma screening and checkup Recommended yearly dental visit for hygiene and checkup  Vaccinations: Influenza vaccine: due Fall 2022  Pneumococcal vaccine:Up to date per patient. Bring the dates to your physical next month so we can document in chart Tdap vaccine: due, will complete at pharmacy   Shingles vaccine: Up to date per patient. Bring the dates to your physical next month so we can document in chart   Covid-19: Up to date per patient. Bring the dates to your physical next month so we can document in chart  Advanced directives: Please bring a copy of your POA (Power of [place]) and/or Living Will to your next appointment.   Conditions/risks identified: hypertension   Next appointment: Follow up in one year for your annual wellness visit.   Preventive Care 67 Years and Older, Male Preventive care refers to lifestyle choices and visits with your health care provider that can promote health and wellness. What does preventive care include?  A yearly physical exam. This is also called an annual well check.  Dental exams once or twice a year.  Routine eye exams. Ask your health care provider how often you should have your eyes checked.  Personal lifestyle choices, including:  Daily care of your teeth and gums.  Regular physical activity.  Eating a healthy diet.  Avoiding tobacco and drug use.  Limiting alcohol use.  Practicing safe sex.  Taking low doses of aspirin every day.  Taking vitamin and mineral supplements as recommended by your health care provider. What happens during an annual  well check? The services and screenings done by your health care provider during your annual well check will depend on your age, overall health, lifestyle risk factors, and family history of disease. Counseling  Your health care provider may ask you questions about your:  Alcohol use.  Tobacco use.  Drug use.  Emotional well-being.  Home and relationship well-being.  Sexual activity.  Eating habits.  History of falls.  Memory and ability to understand (cognition).  Work and work Statistician. Screening  You may have the following tests or measurements:  Height, weight, and BMI.  Blood pressure.  Lipid and cholesterol levels. These may be checked every 5 years, or more frequently if you are over 30 years old.  Skin check.  Lung cancer screening. You may have this screening every year starting at age 42 if you have a 30-pack-year history of smoking and currently smoke or have quit within the past 15 years.  Fecal occult blood test (FOBT) of the stool. You may have this test every year starting at age 59.  Flexible sigmoidoscopy or colonoscopy. You may have a sigmoidoscopy every 5 years or a colonoscopy every 10 years starting at age 29.  Prostate cancer screening. Recommendations will vary depending on your family history and other risks.  Hepatitis C blood test.  Hepatitis B blood test.  Sexually transmitted disease (STD) testing.  Diabetes screening. This is done by checking your blood sugar (glucose) after you have not eaten for a while (fasting). You may have this done every 1-3 years.  Abdominal aortic aneurysm (AAA) screening. You may need this if you  are a current or former smoker.  Osteoporosis. You may be screened starting at age 91 if you are at high risk. Talk with your health care provider about your test results, treatment options, and if necessary, the need for more tests. Vaccines  Your health care provider may recommend certain vaccines, such  as:  Influenza vaccine. This is recommended every year.  Tetanus, diphtheria, and acellular pertussis (Tdap, Td) vaccine. You may need a Td booster every 10 years.  Zoster vaccine. You may need this after age 82.  Pneumococcal 13-valent conjugate (PCV13) vaccine. One dose is recommended after age 48.  Pneumococcal polysaccharide (PPSV23) vaccine. One dose is recommended after age 74. Talk to your health care provider about which screenings and vaccines you need and how often you need them. This information is not intended to replace advice given to you by your health care provider. Make sure you discuss any questions you have with your health care provider. Document Released: 06/09/2015 Document Revised: 01/31/2016 Document Reviewed: 03/14/2015 Elsevier Interactive Patient Education  2017 [place] Prevention in the Home Falls can cause injuries. They can happen to people of all ages. There are many things you can do to make your home safe and to help prevent falls. What can I do on the outside of my home?  Regularly fix the edges of walkways and driveways and fix any cracks.  Remove anything that might make you trip as you walk through a door, such as a raised step or threshold.  Trim any bushes or trees on the path to your home.  Use bright outdoor lighting.  Clear any walking paths of anything that might make someone trip, such as rocks or tools.  Regularly check to see if handrails are loose or broken. Make sure that both sides of any steps have handrails.  Any raised decks and porches should have guardrails on the edges.  Have any leaves, snow, or ice cleared regularly.  Use sand or salt on walking paths during winter.  Clean up any spills in your garage right away. This includes oil or grease spills. What can I do in the bathroom?  Use night lights.  Install grab bars by the toilet and in the tub and shower. Do not use towel bars as grab bars.  Use  non-skid mats or decals in the tub or shower.  If you need to sit down in the shower, use a plastic, non-slip stool.  Keep the floor dry. Clean up any water that spills on the floor as soon as it happens.  Remove soap buildup in the tub or shower regularly.  Attach bath mats securely with double-sided non-slip rug tape.  Do not have throw rugs and other things on the floor that can make you trip. What can I do in the bedroom?  Use night lights.  Make sure that you have a light by your bed that is easy to reach.  Do not use any sheets or blankets that are too big for your bed. They should not hang down onto the floor.  Have a firm chair that has side arms. You can use this for support while you get dressed.  Do not have throw rugs and other things on the floor that can make you trip. What can I do in the kitchen?  Clean up any spills right away.  Avoid walking on wet floors.  Keep items that you use a lot in easy-to-reach places.  If you need to reach  something above you, use a strong step stool that has a grab bar.  Keep electrical cords out of the way.  Do not use floor polish or wax that makes floors slippery. If you must use wax, use non-skid floor wax.  Do not have throw rugs and other things on the floor that can make you trip. What can I do with my stairs?  Do not leave any items on the stairs.  Make sure that there are handrails on both sides of the stairs and use them. Fix handrails that are broken or loose. Make sure that handrails are as long as the stairways.  Check any carpeting to make sure that it is firmly attached to the stairs. Fix any carpet that is loose or worn.  Avoid having throw rugs at the top or bottom of the stairs. If you do have throw rugs, attach them to the floor with carpet tape.  Make sure that you have a light switch at the top of the stairs and the bottom of the stairs. If you do not have them, ask someone to add them for you. What  else can I do to help prevent falls?  Wear shoes that:  Do not have high heels.  Have rubber bottoms.  Are comfortable and fit you well.  Are closed at the toe. Do not wear sandals.  If you use a stepladder:  Make sure that it is fully opened. Do not climb a closed stepladder.  Make sure that both sides of the stepladder are locked into place.  Ask someone to hold it for you, if possible.  Clearly mark and make sure that you can see:  Any grab bars or handrails.  First and last steps.  Where the edge of each step is.  Use tools that help you move around (mobility aids) if they are needed. These include:  Canes.  Walkers.  Scooters.  Crutches.  Turn on the lights when you go into a dark area. Replace any light bulbs as soon as they burn out.  Set up your furniture so you have a clear path. Avoid moving your furniture around.  If any of your floors are uneven, fix them.  If there are any pets around you, be aware of where they are.  Review your medicines with your doctor. Some medicines can make you feel dizzy. This can increase your chance of falling. Ask your doctor what other things that you can do to help prevent falls. This information is not intended to replace advice given to you by your health care provider. Make sure you discuss any questions you have with your health care provider. Document Released: 03/09/2009 Document Revised: 10/19/2015 Document Reviewed: 06/17/2014 Elsevier Interactive Patient Education  2017 [employer].

## 2020-10-13 NOTE — Progress Notes (Signed)
Subjective:   Christopher Elliott. is a 65 y.o. male who presents for Medicare Annual/Subsequent preventive examination.  Review of Systems: N/A     I connected with the patient today by telephone and verified that I am speaking with the correct person using two identifiers. Location patient: home Location nurse: work Persons participating in the telephone visit: patient, nurse.   I discussed the limitations, risks, security and privacy concerns of performing an evaluation and management service by telephone and the availability of in person appointments. I also discussed with the patient that there may be a patient responsible charge related to this service. The patient expressed understanding and verbally consented to this telephonic visit.        Cardiac Risk Factors include: advanced age (>84men, >23 women);male gender;hypertension     Objective:    Today's Vitals   There is no height or weight on file to calculate BMI.  Advanced Directives 10/13/2020 03/27/2015  Does Patient Have a Medical Advance Directive? Yes No  Type of Paramedic of [place];Living will -  Copy of [place] in Chart? No - copy requested -  Would patient like information on creating a medical advance directive? - No - patient declined information    Current Medications (verified) Outpatient Encounter Medications as of 10/13/2020  Medication Sig  . apixaban (ELIQUIS) 5 MG TABS tablet Take by mouth.  . diltiazem (CARDIZEM) 120 MG tablet Take by mouth.  . olmesartan-hydrochlorothiazide (BENICAR HCT) 40-25 MG tablet TAKE 1/2 TABLET BY MOUTH DAILY (Patient not taking: Reported on 10/13/2020)   No facility-administered encounter medications on file as of 10/13/2020.    Allergies (verified) Patient has no known allergies.   History: Past Medical History:  Diagnosis Date  . Hypertension   . LVH (left ventricular hypertrophy)    Noted on echo 2019.  Marland Kitchen Syncope     Past Surgical History:  Procedure Laterality Date  . BACK SURGERY    . COLONOSCOPY WITH PROPOFOL N/A 03/27/2015   Procedure: COLONOSCOPY WITH PROPOFOL;  Surgeon: Josefine Class, MD;  [facility] ENDOSCOPY;  Service: Endoscopy;  Laterality: N/A;  . TONSILLECTOMY     Family History  Problem Relation Age of Onset  . Diabetes Mother   . Lung cancer Father   . Colon cancer Neg Hx   . Prostate cancer Neg Hx    Social History   Socioeconomic History  . Marital status: Married    Spouse name: Not on file  . Number of children: Not on file  . Years of education: Not on file  . Highest education level: Not on file  Occupational History  . Not on file  Tobacco Use  . Smoking status: Never Smoker  . Smokeless tobacco: Former Network engineer  . Vaping Use: Never used  Substance and Sexual Activity  . Alcohol use: Yes    Comment: Occasional, on the weekends  . Drug use: Never  . Sexual activity: Yes  Other Topics Concern  . Not on file  Social History Narrative   Married 1976   3 kids, none local.  4 grandkids.   College graduate.     Works in Press photographer for [employer].  Some travel with work but usually home at night.   Grew up in [place].  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  No known concussions.   [place] [employer] fan.   Social Determinants of Health   Financial Resource Strain: Low  Risk   . Difficulty of Paying Living Expenses: Not hard at all  Food Insecurity: No Food Insecurity  . Worried About Programme researcher, broadcasting/film/video in the Last Year: Never true  . Ran Out of Food in the Last Year: Never true  Transportation Needs: No Transportation Needs  . Lack of Transportation (Medical): No  . Lack of Transportation (Non-Medical): No  Physical Activity: Inactive  . Days of Exercise per Week: 0 days  . Minutes of Exercise per Session: 0 min  Stress: No Stress Concern Present  . Feeling of Stress : Not at all  Social Connections: Not on file    Tobacco  Counseling Counseling given: Not Answered   Clinical Intake:  Pre-visit preparation completed: Yes  Pain : No/denies pain     Nutritional Risks: None Diabetes: No  How often do you need to have someone help you when you read instructions, pamphlets, or other written materials from your doctor or pharmacy?: 1 - Never What is the last grade level you completed in school?: college  Diabetic: No Nutrition Risk Assessment:  Has the patient had any N/V/D within the last 2 months?  No  Does the patient have any non-healing wounds?  No  Has the patient had any unintentional weight loss or weight gain?  No   Diabetes:  Is the patient diabetic?  No  If diabetic, was a CBG obtained today?  N/A Did the patient bring in their glucometer from home?  N/A How often do you monitor your CBG's? N/A.   Financial Strains and Diabetes Management:  Are you having any financial strains with the device, your supplies or your medication? N/A.  Does the patient want to be seen by Chronic Care Management for management of their diabetes?  N/A Would the patient like to be referred to a Nutritionist or for Diabetic Management?  N/A Interpreter Needed?: No  Information entered by :: CJohnson, LPN   Activities of Daily Living In your present state of health, do you have any difficulty performing the following activities: 10/13/2020  Hearing? N  Vision? N  Difficulty concentrating or making decisions? N  Walking or climbing stairs? N  Dressing or bathing? N  Doing errands, shopping? N  Preparing Food and eating ? N  Using the Toilet? N  In the past six months, have you accidently leaked urine? N  Do you have problems with loss of bowel control? N  Managing your Medications? N  Managing your Finances? N  Housekeeping or managing your Housekeeping? N  Some recent data might be hidden    Patient Care Team: Joaquim Nam, MD as PCP - General (Family Medicine)  Indicate any recent Medical  Services you may have received from other than [facility] providers in the past year (date may be approximate).     Assessment:   This is a routine wellness examination for [facility].  Hearing/Vision screen  Hearing Screening   125Hz  250Hz  500Hz  1000Hz  2000Hz  3000Hz  4000Hz  6000Hz  8000Hz   Right ear:           Left ear:           Vision Screening Comments: Patient gets annual eye exams   Dietary issues and exercise activities discussed: Current Exercise Habits: The patient does not participate in regular exercise at present, Exercise limited by: None identified  Goals Addressed            This Visit's Progress   . Patient Stated  10/13/2020, I will maintain and continue medications as prescribed.       Depression Screen PHQ 2/9 Scores 10/13/2020 11/09/2019  PHQ - 2 Score 0 0  PHQ- 9 Score 0 -    Fall Risk Fall Risk  10/13/2020 11/09/2019  Falls in the past year? 0 1  Number falls in past yr: 0 1  Injury with Fall? 0 -  Risk for fall due to : Medication side effect -  Follow up Falls evaluation completed;Falls prevention discussed -    FALL RISK PREVENTION PERTAINING TO THE HOME:  Any stairs in or around the home? Yes  If so, are there any without handrails? No  Home free of loose throw rugs in walkways, pet beds, electrical cords, etc? Yes  Adequate lighting in your home to reduce risk of falls? Yes   ASSISTIVE DEVICES UTILIZED TO PREVENT FALLS:  Life alert? No  Use of a cane, walker or w/c? No  Grab bars in the bathroom? No  Shower chair or bench in shower? No  Elevated toilet seat or a handicapped toilet? No   TIMED UP AND GO:  Was the test performed? N/A telephone visit .  Cognitive Function: MMSE - Mini Mental State Exam 10/13/2020  Orientation to time 5  Orientation to Place 5  Registration 3  Attention/ Calculation 5  Recall 3  Language- repeat 1       Mini Cog  Mini-Cog screen was completed. Maximum score is 22. A value of 0 denotes this part of the  MMSE was not completed or the patient failed this part of the Mini-Cog screening.  Immunizations Immunization History  Administered Date(s) Administered  . Influenza,inj,Quad PF,6+ Mos 02/12/2018  . Influenza-Unspecified 04/08/2017  . Moderna Sars-Covid-2 Vaccination 07/07/2019, 08/04/2019    TDAP status: Due, Education has been provided regarding the importance of this vaccine. Advised may receive this vaccine at local pharmacy or Health Dept. Aware to provide a copy of the vaccination record if obtained from local pharmacy or Health Dept. Verbalized acceptance and understanding.  Flu Vaccine status: due Fall 2022  Pneumococcal vaccine status: Up to date per patient. States he received at pharmacy and will bring the dates to his physical next month so we can document in chart  Covid-19 vaccine status: Up to date per patient. States he received at pharmacy and will bring the dates to his physical next month so we can document in chart   Qualifies for Shingles Vaccine? Yes   Zostavax completed No   Shingrix Completed: Up to date per patient. States he received at pharmacy and will bring the dates to his physical next month so we can document in chart  Screening Tests Health Maintenance  Topic Date Due  . TETANUS/TDAP  Never done  . PNA vac Low Risk Adult (1 of 2 - PCV13) Never done  . COVID-19 Vaccine (3 - Booster for Moderna series) 01/04/2020  . COLONOSCOPY (Pts 45-56yrs Insurance coverage will need to be confirmed)  03/26/2020  . INFLUENZA VACCINE  12/25/2020  . Hepatitis C Screening  Completed  . HPV VACCINES  Aged Out    Health Maintenance  Health Maintenance Due  Topic Date Due  . TETANUS/TDAP  Never done  . PNA vac Low Risk Adult (1 of 2 - PCV13) Never done  . COVID-19 Vaccine (3 - Booster for Moderna series) 01/04/2020  . COLONOSCOPY (Pts 45-41yrs Insurance coverage will need to be confirmed)  03/26/2020    Colorectal cancer screening: due, will discuss  with  provider at physical   Lung Cancer Screening: (Low Dose CT Chest recommended if Age 31-80 years, 30 pack-year currently smoking OR have quit w/in 15 years) does not qualify.    Additional Screening:  Hepatitis C Screening: does qualify; Completed 09/16/2018  Vision Screening: Recommended annual ophthalmology exams for early detection of glaucoma and other disorders of the eye. Is the patient up to date with their annual eye exam?  Yes  Who is the provider or what is the name of the office in which the patient attends annual eye exams? Eye doctor at [facility], [place] If pt is not established with a provider, would they like to be referred to a provider to establish care? No .   Dental Screening: Recommended annual dental exams for proper oral hygiene  Community Resource Referral / Chronic Care Management: CRR required this visit?  No   CCM required this visit?  No      Plan:     I have personally reviewed and noted the following in the patient's chart:   . Medical and social history . Use of alcohol, tobacco or illicit drugs  . Current medications and supplements including opioid prescriptions. Patient is not currently taking opioid prescriptions. . Functional ability and status . Nutritional status . Physical activity . Advanced directives . List of other physicians . Hospitalizations, surgeries, and ER visits in previous 12 months . Vitals . Screenings to include cognitive, depression, and falls . Referrals and appointments  In addition, I have reviewed and discussed with patient certain preventive protocols, quality metrics, and best practice recommendations. A written personalized care plan for preventive services as well as general preventive health recommendations were provided to patient.   Due to this being a telephonic visit, the after visit summary with patients personalized plan was offered to patient via office or my-chart. Patient preferred to pick up at office at  next visit or via mychart.   Andrez Grime, LPN   01/11/5630

## 2020-11-10 ENCOUNTER — Encounter: Payer: Self-pay | Admitting: Family Medicine

## 2020-11-10 ENCOUNTER — Other Ambulatory Visit: Payer: Self-pay

## 2020-11-10 ENCOUNTER — Ambulatory Visit (INDEPENDENT_AMBULATORY_CARE_PROVIDER_SITE_OTHER): Payer: Medicare PPO | Admitting: Family Medicine

## 2020-11-10 VITALS — BP 158/84 | HR 72 | Temp 97.2°F | Ht 69.0 in | Wt 254.0 lb

## 2020-11-10 DIAGNOSIS — I10 Essential (primary) hypertension: Secondary | ICD-10-CM | POA: Diagnosis not present

## 2020-11-10 DIAGNOSIS — G4733 Obstructive sleep apnea (adult) (pediatric): Secondary | ICD-10-CM | POA: Diagnosis not present

## 2020-11-10 DIAGNOSIS — Z1211 Encounter for screening for malignant neoplasm of colon: Secondary | ICD-10-CM

## 2020-11-10 DIAGNOSIS — I480 Paroxysmal atrial fibrillation: Secondary | ICD-10-CM | POA: Diagnosis not present

## 2020-11-10 DIAGNOSIS — Z0000 Encounter for general adult medical examination without abnormal findings: Secondary | ICD-10-CM

## 2020-11-10 DIAGNOSIS — Z7189 Other specified counseling: Secondary | ICD-10-CM

## 2020-11-10 LAB — COMPREHENSIVE METABOLIC PANEL
ALT: 32 U/L (ref 0–53)
AST: 20 U/L (ref 0–37)
Albumin: 4.2 g/dL (ref 3.5–5.2)
Alkaline Phosphatase: 93 U/L (ref 39–117)
BUN: 23 mg/dL (ref 6–23)
CO2: 28 mEq/L (ref 19–32)
Calcium: 9.8 mg/dL (ref 8.4–10.5)
Chloride: 104 mEq/L (ref 96–112)
Creatinine, Ser: 0.93 mg/dL (ref 0.40–1.50)
GFR: 85.28 mL/min (ref >60.00)
Glucose, Bld: 89 mg/dL (ref 70–99)
Potassium: 4.7 mEq/L (ref 3.5–5.1)
Sodium: 138 mEq/L (ref 135–145)
Total Bilirubin: 0.6 mg/dL (ref 0.2–1.2)
Total Protein: 7.4 g/dL (ref 6.0–8.3)

## 2020-11-10 LAB — CBC WITH DIFFERENTIAL/PLATELET
Basophils Absolute: 0.1 10*3/uL (ref 0.0–0.1)
Basophils Relative: 0.6 % (ref 0.0–3.0)
Eosinophils Absolute: 0.2 10*3/uL (ref 0.0–0.7)
Eosinophils Relative: 2.2 % (ref 0.0–5.0)
HCT: 49.4 % (ref 39.0–52.0)
Hemoglobin: 16.4 g/dL (ref 13.0–17.0)
Lymphocytes Relative: 35.4 % (ref 12.0–46.0)
Lymphs Abs: 3.1 10*3/uL (ref 0.7–4.0)
MCHC: 33.2 g/dL (ref 30.0–36.0)
MCV: 90 fl (ref 78.0–100.0)
Monocytes Absolute: 1 10*3/uL (ref 0.1–1.0)
Monocytes Relative: 11.4 % (ref 3.0–12.0)
Neutro Abs: 4.4 10*3/uL (ref 1.4–7.7)
Neutrophils Relative %: 50.4 % (ref 43.0–77.0)
Platelets: 229 10*3/uL (ref 150.0–400.0)
RBC: 5.48 Mil/uL (ref 4.22–5.81)
RDW: 15.3 % (ref 11.5–15.5)
WBC: 8.7 10*3/uL (ref 4.0–10.5)

## 2020-11-10 LAB — LIPID PANEL
Cholesterol: 207 mg/dL — ABNORMAL HIGH (ref 0–200)
HDL: 49.4 mg/dL (ref >39.00)
LDL Cholesterol: 117 mg/dL — ABNORMAL HIGH (ref 0–99)
NonHDL: 157.21
Total CHOL/HDL Ratio: 4
Triglycerides: 199 mg/dL — ABNORMAL HIGH (ref 0.0–149.0)
VLDL: 39.8 mg/dL (ref 0.0–40.0)

## 2020-11-10 LAB — TSH: TSH: 2.66 u[IU]/mL (ref 0.35–4.50)

## 2020-11-10 MED ORDER — DILTIAZEM HCL 120 MG PO TABS: 120.0000 mg | ORAL_TABLET | Freq: Every day | ORAL | Status: AC

## 2020-11-10 MED ORDER — APIXABAN 5 MG PO TABS: 5.0000 mg | ORAL_TABLET | Freq: Two times a day (BID) | ORAL | Status: AC

## 2020-11-10 NOTE — Patient Instructions (Signed)
Keep working on your weight and let me know if your BP is still persistently above 140/90.  Go to the lab on the way out.   If you have mychart we'll likely use that to update you.    Take care.  Glad to see you. We'll call about seeing GI at [facility].

## 2020-11-10 NOTE — Progress Notes (Signed)
This visit occurred during the SARS-CoV-2 public health emergency.  Safety protocols were in place, including screening questions prior to the visit, additional usage of staff PPE, and extensive cleaning of exam room while observing appropriate contact time as indicated for disinfecting solutions.  PAF/HTN. He hasn't been known to be in A fib recently.   Using medication without problems or lightheadedness: yes Chest pain with exertion:no Edema:no Short of breath:no Labs d/w pt.   Rationale for current medications discussed with patient.  OSA on CPAP.  Sleeping well with use.  Waking with more energy.  Compliant.  He had his covid vaccine.  D/w pt. Flu prev done.   Tetanus 2021 Shingrix prev done.    PNA 2021 Wife designated if patient were incapacitated.   HIV and HCV prev done.   Nonsmoker, not due for AAA screening.   Colonoscopy 2016, referral placed.   Prostate cancer screening and PSA options (with potential risks and benefits of testing vs not testing) were discussed along with recent recs/guidelines.  He declined testing PSA at this point.  Meds, vitals, and allergies reviewed.  PMH and SH reviewed  ROS: Per HPI unless specifically indicated in ROS section   GEN: nad, alert and oriented HEENT: ncat NECK: supple w/o LA CV: rrr. PULM: ctab, no inc wob ABD: soft, +bs EXT: no edema SKIN: no acute rash  34 minutes were devoted to patient care in this encounter (this includes time spent reviewing the patient's file/history, interviewing and examining the patient, counseling/reviewing plan with patient).

## 2020-11-12 NOTE — Assessment & Plan Note (Signed)
He had his covid vaccine.  D/w pt. Flu prev done.   Tetanus 2021 Shingrix prev done.    PNA 2021 Wife designated if patient were incapacitated.   HIV and HCV prev done.   Nonsmoker, not due for AAA screening.   Colonoscopy 2016, referral placed.   Prostate cancer screening and PSA options (with potential risks and benefits of testing vs not testing) were discussed along with recent recs/guidelines.  He declined testing PSA at this point.

## 2020-11-12 NOTE — Assessment & Plan Note (Signed)
Doing well with CPAP.  Continue as is.  Compliant.

## 2020-11-12 NOTE — Assessment & Plan Note (Signed)
Continue anticoagulation with Eliquis and continue diltiazem.  Rationale for use discussed with patient.  See notes on labs.  Okay for outpatient follow-up.

## 2020-11-12 NOTE — Assessment & Plan Note (Signed)
Wife designated if patient were incapacitated.  

## 2020-12-11 ENCOUNTER — Telehealth: Payer: Self-pay | Admitting: *Deleted

## 2020-12-11 DIAGNOSIS — Z1211 Encounter for screening for malignant neoplasm of colon: Secondary | ICD-10-CM

## 2020-12-11 NOTE — Telephone Encounter (Signed)
Pt called into the scheduler line requesting colonoscopy referral changed. Pt told PCP he wanted it done at [facility] but now he needs referral changed and he wants it done at [facility] in [place], [place]  Will route to PCP and referral pool so they are aware

## 2020-12-12 NOTE — Telephone Encounter (Signed)
I put in a new referral.  I routed this to referrals.  Thanks for the help.

## 2020-12-19 NOTE — Telephone Encounter (Addendum)
I need to know where exactly the patient wants to be referred -- Donnald Garre never referred to a Hospital for a colonoscopy.   Is there a fax number the patient has? Is there a Doctor the patient is requesting do this?

## 2020-12-20 NOTE — Telephone Encounter (Signed)
Sent mychart message to pt asking these questions.

## 2020-12-25 NOTE — Telephone Encounter (Signed)
Pt called in. He states he does not have this information and just wants to be referred somewhere in [facility].

## 2020-12-27 NOTE — Telephone Encounter (Signed)
Spoke with patient about referral. He is going to look into finding where referral needs to be sent to and will call back with correct information.

## 2020-12-29 NOTE — Telephone Encounter (Signed)
Patient called back with information for referral for colonoscopy. Patient states a doctor comes  in every so often and does the colonoscopies there at the hospital. He stated the only thing he could find out was referrals can be faxed to [phone]; phone number is [phone] and their address is [address], [place].

## 2021-01-01 DIAGNOSIS — S61209A Unspecified open wound of unspecified finger without damage to nail, initial encounter: Secondary | ICD-10-CM | POA: Diagnosis not present

## 2021-01-01 DIAGNOSIS — I10 Essential (primary) hypertension: Secondary | ICD-10-CM | POA: Diagnosis not present

## 2021-01-01 DIAGNOSIS — Z7901 Long term (current) use of anticoagulants: Secondary | ICD-10-CM | POA: Diagnosis not present

## 2021-01-01 NOTE — Telephone Encounter (Signed)
Noted Will work on referral

## 2021-01-04 NOTE — Telephone Encounter (Signed)
Referral faxed and pt notified  Nothing further needed.

## 2021-01-11 DIAGNOSIS — I480 Paroxysmal atrial fibrillation: Secondary | ICD-10-CM | POA: Diagnosis not present

## 2021-01-11 DIAGNOSIS — I10 Essential (primary) hypertension: Secondary | ICD-10-CM | POA: Diagnosis not present

## 2021-06-06 DIAGNOSIS — R030 Elevated blood-pressure reading, without diagnosis of hypertension: Secondary | ICD-10-CM | POA: Diagnosis not present

## 2021-06-06 DIAGNOSIS — E669 Obesity, unspecified: Secondary | ICD-10-CM | POA: Diagnosis not present

## 2021-06-06 DIAGNOSIS — Z6836 Body mass index (BMI) 36.0-36.9, adult: Secondary | ICD-10-CM | POA: Diagnosis not present

## 2021-06-06 DIAGNOSIS — Z7901 Long term (current) use of anticoagulants: Secondary | ICD-10-CM | POA: Diagnosis not present

## 2021-06-06 DIAGNOSIS — I4891 Unspecified atrial fibrillation: Secondary | ICD-10-CM | POA: Diagnosis not present

## 2021-06-06 DIAGNOSIS — Z809 Family history of malignant neoplasm, unspecified: Secondary | ICD-10-CM | POA: Diagnosis not present

## 2021-06-11 ENCOUNTER — Encounter: Payer: Self-pay | Admitting: Family Medicine

## 2021-06-11 ENCOUNTER — Telehealth: Payer: Self-pay | Admitting: Family Medicine

## 2021-06-11 NOTE — Telephone Encounter (Signed)
Called and spoke with patient and he was requesting medication for a prescription for his colonoscopy. I advised patient to call his GI doctor as I have not received anything from them to do.

## 2021-06-11 NOTE — Telephone Encounter (Signed)
Pt called stating that he needs his prescription for his colonoscopy Pt states that he needs it toady or least by tomorrow morning. Please advise.

## 2021-06-14 DIAGNOSIS — Z1211 Encounter for screening for malignant neoplasm of colon: Secondary | ICD-10-CM | POA: Diagnosis not present

## 2021-06-14 DIAGNOSIS — K648 Other hemorrhoids: Secondary | ICD-10-CM | POA: Diagnosis not present

## 2021-06-14 DIAGNOSIS — K635 Polyp of colon: Secondary | ICD-10-CM | POA: Diagnosis not present

## 2021-06-14 DIAGNOSIS — Z79899 Other long term (current) drug therapy: Secondary | ICD-10-CM | POA: Diagnosis not present

## 2021-06-14 DIAGNOSIS — K5730 Diverticulosis of large intestine without perforation or abscess without bleeding: Secondary | ICD-10-CM | POA: Diagnosis not present

## 2021-06-14 DIAGNOSIS — D124 Benign neoplasm of descending colon: Secondary | ICD-10-CM | POA: Diagnosis not present

## 2021-06-14 DIAGNOSIS — Z86010 Personal history of colonic polyps: Secondary | ICD-10-CM | POA: Diagnosis not present

## 2021-07-20 DIAGNOSIS — Z7901 Long term (current) use of anticoagulants: Secondary | ICD-10-CM | POA: Diagnosis not present

## 2021-07-20 DIAGNOSIS — I10 Essential (primary) hypertension: Secondary | ICD-10-CM | POA: Diagnosis not present

## 2021-07-20 DIAGNOSIS — I480 Paroxysmal atrial fibrillation: Secondary | ICD-10-CM | POA: Diagnosis not present

## 2021-10-15 ENCOUNTER — Ambulatory Visit: Payer: Medicare PPO

## 2021-11-02 ENCOUNTER — Telehealth: Payer: Self-pay | Admitting: Family Medicine

## 2021-11-02 NOTE — Telephone Encounter (Signed)
Spoke to patient to schedule AWV  He wanted call back Monday 11/05/21 to schedule

## 2021-11-05 ENCOUNTER — Telehealth: Payer: Self-pay | Admitting: Family Medicine

## 2021-11-05 NOTE — Telephone Encounter (Signed)
Left message for patient to call back and schedule Medicare Annual Wellness Visit (AWV) either virtually or phone   Last AWV ;10/13/20  please schedule at anytime with health coach    I left my direct # [phone]

## 2021-12-13 ENCOUNTER — Telehealth: Payer: Self-pay | Admitting: Family Medicine

## 2021-12-13 NOTE — Telephone Encounter (Signed)
LVM for pt to rtn my call to schedule AWV, labs and cpe. Call back # [phone]

## 2021-12-21 ENCOUNTER — Telehealth: Payer: Self-pay

## 2021-12-21 ENCOUNTER — Ambulatory Visit: Payer: Medicare PPO

## 2021-12-21 NOTE — Telephone Encounter (Signed)
Unsuccessful attempt to reach patient on preferred number listed in notes for scheduled AWV. Left message on voicemail okay to reschedule. 

## 2021-12-21 NOTE — Telephone Encounter (Signed)
Patient called to cancel health nurse visit today 12/21/2021 and wants to reschedule.

## 2021-12-28 ENCOUNTER — Ambulatory Visit (INDEPENDENT_AMBULATORY_CARE_PROVIDER_SITE_OTHER): Payer: Medicare PPO

## 2021-12-28 VITALS — Ht 71.0 in | Wt 255.0 lb

## 2021-12-28 DIAGNOSIS — Z0000 Encounter for general adult medical examination without abnormal findings: Secondary | ICD-10-CM

## 2021-12-28 NOTE — Patient Instructions (Signed)
Christopher Elliott , Thank you for taking time to come for your Medicare Wellness Visit. I appreciate your ongoing commitment to your health goals. Please review the following plan we discussed and let me know if I can assist you in the future.   Screening recommendations/referrals: Colonoscopy: completed 06/14/2021, due 06/14/2026 Recommended yearly ophthalmology/optometry visit for glaucoma screening and checkup Recommended yearly dental visit for hygiene and checkup  Vaccinations: Influenza vaccine: due Pneumococcal vaccine: completed 01/26/2020 Tdap vaccine: completed 01/26/2020, due 01/25/2030 Shingles vaccine: completed   Covid-19:  06/05/2020, 08/04/2019, 07/07/2019  Advanced directives: Please bring a copy of your POA (Power of Attorney) and/or Living Will to your next appointment.   Conditions/risks identified: none  Next appointment: Follow up in one year for your annual wellness visit.   Preventive Care 68 Years and Older, Male Preventive care refers to lifestyle choices and visits with your health care provider that can promote health and wellness. What does preventive care include? A yearly physical exam. This is also called an annual well check. Dental exams once or twice a year. Routine eye exams. Ask your health care provider how often you should have your eyes checked. Personal lifestyle choices, including: Daily care of your teeth and gums. Regular physical activity. Eating a healthy diet. Avoiding tobacco and drug use. Limiting alcohol use. Practicing safe sex. Taking low doses of aspirin every day. Taking vitamin and mineral supplements as recommended by your health care provider. What happens during an annual well check? The services and screenings done by your health care provider during your annual well check will depend on your age, overall health, lifestyle risk factors, and family history of disease. Counseling  Your health care provider may ask you questions about  your: Alcohol use. Tobacco use. Drug use. Emotional well-being. Home and relationship well-being. Sexual activity. Eating habits. History of falls. Memory and ability to understand (cognition). Work and work Statistician. Screening  You may have the following tests or measurements: Height, weight, and BMI. Blood pressure. Lipid and cholesterol levels. These may be checked every 5 years, or more frequently if you are over 68 years old. Skin check. Lung cancer screening. You may have this screening every year starting at age 68 if you have a 30-pack-year history of smoking and currently smoke or have quit within the past 15 years. Fecal occult blood test (FOBT) of the stool. You may have this test every year starting at age 68. Flexible sigmoidoscopy or colonoscopy. You may have a sigmoidoscopy every 5 years or a colonoscopy every 10 years starting at age 68. Prostate cancer screening. Recommendations will vary depending on your family history and other risks. Hepatitis C blood test. Hepatitis B blood test. Sexually transmitted disease (STD) testing. Diabetes screening. This is done by checking your blood sugar (glucose) after you have not eaten for a while (fasting). You may have this done every 1-3 years. Abdominal aortic aneurysm (AAA) screening. You may need this if you are a current or former smoker. Osteoporosis. You may be screened starting at age 68 if you are at high risk. Talk with your health care provider about your test results, treatment options, and if necessary, the need for more tests. Vaccines  Your health care provider may recommend certain vaccines, such as: Influenza vaccine. This is recommended every year. Tetanus, diphtheria, and acellular pertussis (Tdap, Td) vaccine. You may need a Td booster every 10 years. Zoster vaccine. You may need this after age 68. Pneumococcal 13-valent conjugate (PCV13) vaccine. One dose is  recommended after age 68. Pneumococcal  polysaccharide (PPSV23) vaccine. One dose is recommended after age 68. Talk to your health care provider about which screenings and vaccines you need and how often you need them. This information is not intended to replace advice given to you by your health care provider. Make sure you discuss any questions you have with your health care provider. Document Released: 06/09/2015 Document Revised: 01/31/2016 Document Reviewed: 03/14/2015 Elsevier Interactive Patient Education  2017 [place] Prevention in the Home Falls can cause injuries. They can happen to people of all ages. There are many things you can do to make your home safe and to help prevent falls. What can I do on the outside of my home? Regularly fix the edges of walkways and driveways and fix any cracks. Remove anything that might make you trip as you walk through a door, such as a raised step or threshold. Trim any bushes or trees on the path to your home. Use bright outdoor lighting. Clear any walking paths of anything that might make someone trip, such as rocks or tools. Regularly check to see if handrails are loose or broken. Make sure that both sides of any steps have handrails. Any raised decks and porches should have guardrails on the edges. Have any leaves, snow, or ice cleared regularly. Use sand or salt on walking paths during winter. Clean up any spills in your garage right away. This includes oil or grease spills. What can I do in the bathroom? Use night lights. Install grab bars by the toilet and in the tub and shower. Do not use towel bars as grab bars. Use non-skid mats or decals in the tub or shower. If you need to sit down in the shower, use a plastic, non-slip stool. Keep the floor dry. Clean up any water that spills on the floor as soon as it happens. Remove soap buildup in the tub or shower regularly. Attach bath mats securely with double-sided non-slip rug tape. Do not have throw rugs and other  things on the floor that can make you trip. What can I do in the bedroom? Use night lights. Make sure that you have a light by your bed that is easy to reach. Do not use any sheets or blankets that are too big for your bed. They should not hang down onto the floor. Have a firm chair that has side arms. You can use this for support while you get dressed. Do not have throw rugs and other things on the floor that can make you trip. What can I do in the kitchen? Clean up any spills right away. Avoid walking on wet floors. Keep items that you use a lot in easy-to-reach places. If you need to reach something above you, use a strong step stool that has a grab bar. Keep electrical cords out of the way. Do not use floor polish or wax that makes floors slippery. If you must use wax, use non-skid floor wax. Do not have throw rugs and other things on the floor that can make you trip. What can I do with my stairs? Do not leave any items on the stairs. Make sure that there are handrails on both sides of the stairs and use them. Fix handrails that are broken or loose. Make sure that handrails are as long as the stairways. Check any carpeting to make sure that it is firmly attached to the stairs. Fix any carpet that is loose or worn. Avoid having  throw rugs at the top or bottom of the stairs. If you do have throw rugs, attach them to the floor with carpet tape. Make sure that you have a light switch at the top of the stairs and the bottom of the stairs. If you do not have them, ask someone to add them for you. What else can I do to help prevent falls? Wear shoes that: Do not have high heels. Have rubber bottoms. Are comfortable and fit you well. Are closed at the toe. Do not wear sandals. If you use a stepladder: Make sure that it is fully opened. Do not climb a closed stepladder. Make sure that both sides of the stepladder are locked into place. Ask someone to hold it for you, if possible. Clearly  mark and make sure that you can see: Any grab bars or handrails. First and last steps. Where the edge of each step is. Use tools that help you move around (mobility aids) if they are needed. These include: Canes. Walkers. Scooters. Crutches. Turn on the lights when you go into a dark area. Replace any light bulbs as soon as they burn out. Set up your furniture so you have a clear path. Avoid moving your furniture around. If any of your floors are uneven, fix them. If there are any pets around you, be aware of where they are. Review your medicines with your doctor. Some medicines can make you feel dizzy. This can increase your chance of falling. Ask your doctor what other things that you can do to help prevent falls. This information is not intended to replace advice given to you by your health care provider. Make sure you discuss any questions you have with your health care provider. Document Released: 03/09/2009 Document Revised: 10/19/2015 Document Reviewed: 06/17/2014 Elsevier Interactive Patient Education  2017 [employer].

## 2021-12-28 NOTE — Progress Notes (Signed)
I connected with Christopher Elliott today by telephone and verified that I am speaking with the correct person using two identifiers. Location patient: home Location provider: work Persons participating in the virtual visit: Nachmen Mansel, Glenna Durand LPN.   I discussed the limitations, risks, security and privacy concerns of performing an evaluation and management service by telephone and the availability of in person appointments. I also discussed with the patient that there may be a patient responsible charge related to this service. The patient expressed understanding and verbally consented to this telephonic visit.    Interactive audio and video telecommunications were attempted between this provider and patient, however failed, due to patient having technical difficulties OR patient did not have access to video capability.  We continued and completed visit with audio only.     Vital signs may be patient reported or missing.  Subjective:   Christopher Elliott. is a 66 y.o. male who presents for Medicare Annual/Subsequent preventive examination.  Review of Systems     Cardiac Risk Factors include: advanced age (>52mn, >>25women);hypertension;male gender;obesity (BMI >30kg/m2)     Objective:    Today's Vitals   12/28/21 1041  Weight: 255 lb (115.7 kg)  Height: '5\' 11"'$  (1.803 m)   Body mass index is 35.57 kg/m.     12/28/2021   10:44 AM 10/13/2020    9:01 AM 03/27/2015    7:37 AM  Advanced Directives  Does Patient Have a Medical Advance Directive? Yes Yes No  Type of AParamedicof [place]Living will [place] will   Copy of HCricketin Chart? No - copy requested No - copy requested   Would patient like information on creating a medical advance directive?   No - patient declined information    Current Medications (verified) Outpatient Encounter Medications as of 12/28/2021  Medication Sig   apixaban (ELIQUIS)  5 MG TABS tablet Take 1 tablet (5 mg total) by mouth 2 (two) times daily.   losartan (COZAAR) 25 MG tablet Take 25 mg by mouth daily.   diltiazem (CARDIZEM) 120 MG tablet Take 1 tablet (120 mg total) by mouth daily.   No facility-administered encounter medications on file as of 12/28/2021.    Allergies (verified) Patient has no known allergies.   History: Past Medical History:  Diagnosis Date   Atrial fibrillation (HLos Luceros    Hypertension    LVH (left ventricular hypertrophy)    Noted on echo 2019.   Syncope    Past Surgical History:  Procedure Laterality Date   BACK SURGERY     COLONOSCOPY WITH PROPOFOL N/A 03/27/2015   Procedure: COLONOSCOPY WITH PROPOFOL;  Surgeon: MJosefine Class MD;  [facility]  Service: Endoscopy;  Laterality: N/A;   TONSILLECTOMY     Family History  Problem Relation Age of Onset   Diabetes Mother    Lung cancer Father    Colon cancer Neg Hx    Prostate cancer Neg Hx    Social History   Socioeconomic History   Marital status: Married    Spouse name: Not on file   Number of children: Not on file   Years of education: Not on file   Highest education level: Not on file  Occupational History   Not on file  Tobacco Use   Smoking status: Never   Smokeless tobacco: Former  VScientific laboratory technicianUse: Never used  Substance and Sexual Activity   Alcohol use:  Yes    Comment: rare   Drug use: Never   Sexual activity: Yes  Other Topics Concern   Not on file  Social History Narrative   Married 1976   3 kids, none local.  4 grandkids.   College graduate.     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  Works from home as of 2022.     Grew up in [place].  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  No known concussions.   [place] [employer] fan.   Social Determinants of Health   Financial Resource Strain: Low Risk  (12/28/2021)   Overall Financial Resource Strain (CARDIA)    Difficulty of Paying Living Expenses: Not hard at all  Food  Insecurity: No Food Insecurity (12/28/2021)   Hunger Vital Sign    Worried About Running Out of Food in the Last Year: Never true    Ran Out of Food in the Last Year: Never true  Transportation Needs: No Transportation Needs (12/28/2021)   PRAPARE - Hydrologist (Medical): No    Lack of Transportation (Non-Medical): No  Physical Activity: Inactive (12/28/2021)   Exercise Vital Sign    Days of Exercise per Week: 0 days    Minutes of Exercise per Session: 0 min  Stress: No Stress Concern Present (12/28/2021)   [place]    Feeling of Stress : Not at all  Social Connections: Not on file    Tobacco Counseling Counseling given: Not Answered   Clinical Intake:  Pre-visit preparation completed: Yes  Pain : No/denies pain     Nutritional Status: BMI > 30  Obese Nutritional Risks: None Diabetes: No  How often do you need to have someone help you when you read instructions, pamphlets, or other written materials from your doctor or pharmacy?: 1 - Never What is the last grade level you completed in school?: college  Diabetic? no  Interpreter Needed?: No  Information entered by :: NAllen LPN   Activities of Daily Living    12/28/2021   10:45 AM  In your present state of health, do you have any difficulty performing the following activities:  Hearing? 0  Vision? 0  Difficulty concentrating or making decisions? 0  Walking or climbing stairs? 0  Dressing or bathing? 0  Doing errands, shopping? 0  Preparing Food and eating ? N  Using the Toilet? N  In the past six months, have you accidently leaked urine? N  Do you have problems with loss of bowel control? N  Managing your Medications? N  Managing your Finances? N  Housekeeping or managing your Housekeeping? N    Patient Care Team: Tonia Ghent, MD as PCP - General (Family Medicine)  Indicate any recent Medical Services you may  have received from other than [facility] providers in the past year (date may be approximate).     Assessment:   This is a routine wellness examination for [facility].  Hearing/Vision screen Vision Screening - Comments:: Regular eye exams,   Dietary issues and exercise activities discussed: Current Exercise Habits: The patient does not participate in regular exercise at present   Goals Addressed             This Visit's Progress    Patient Stated       12/28/2021, lose weight       Depression Screen    12/28/2021   10:45 AM 10/13/2020    9:03 AM  11/09/2019    2:22 PM  PHQ 2/9 Scores  PHQ - 2 Score 0 0 0  PHQ- 9 Score  0     Fall Risk    12/28/2021   10:44 AM 10/13/2020    9:02 AM 11/09/2019    2:22 PM  [place] in the past year? 0 0 1  Number falls in past yr: 0 0 1  Injury with Fall? 0 0   Risk for fall due to : Medication side effect Medication side effect   Follow up Falls evaluation completed;Education provided;Falls prevention discussed Falls evaluation completed;Falls prevention discussed     FALL RISK PREVENTION PERTAINING TO THE HOME:  Any stairs in or around the home? Yes  If so, are there any without handrails? No  Home free of loose throw rugs in walkways, pet beds, electrical cords, etc? Yes  Adequate lighting in your home to reduce risk of falls? Yes   ASSISTIVE DEVICES UTILIZED TO PREVENT FALLS:  Life alert? No  Use of a cane, walker or w/c? No  Grab bars in the bathroom? Yes  Shower chair or bench in shower? No  Elevated toilet seat or a handicapped toilet? Yes   TIMED UP AND GO:  Was the test performed? No .      Cognitive Function:    10/13/2020    9:07 AM  MMSE - Mini Mental State Exam  Orientation to time 5  Orientation to Place 5  Registration 3  Attention/ Calculation 5  Recall 3  Language- repeat 1        12/28/2021   10:45 AM  6CIT Screen  What Year? 0 points  What month? 0 points  What time? 0 points  Count back from  20 0 points  Months in reverse 0 points  Repeat phrase 2 points  Total Score 2 points    Immunizations Immunization History  Administered Date(s) Administered   Influenza,inj,Quad PF,6+ Mos 02/12/2018   Influenza-Unspecified 04/08/2017   Moderna Sars-Covid-2 Vaccination 07/07/2019, 08/04/2019, 06/05/2020   Pneumococcal Conjugate-13 01/26/2020   Tdap 01/26/2020   Zoster Recombinat (Shingrix) 02/22/2020, 03/27/2020    TDAP status: Up to date  Flu Vaccine status: Due, Education has been provided regarding the importance of this vaccine. Advised may receive this vaccine at local pharmacy or Health Dept. Aware to provide a copy of the vaccination record if obtained from local pharmacy or Health Dept. Verbalized acceptance and understanding.  Pneumococcal vaccine status: Due, Education has been provided regarding the importance of this vaccine. Advised may receive this vaccine at local pharmacy or Health Dept. Aware to provide a copy of the vaccination record if obtained from local pharmacy or Health Dept. Verbalized acceptance and understanding.  Covid-19 vaccine status: Completed vaccines  Qualifies for Shingles Vaccine? Yes   Zostavax completed Yes   Shingrix Completed?: Yes  Screening Tests Health Maintenance  Topic Date Due   COLONOSCOPY (Pts 45-67yr Insurance coverage will need to be confirmed)  03/26/2020   COVID-19 Vaccine (4 - Moderna series) 07/31/2020   Pneumonia Vaccine 68 Years old (2 - PPSV23 or PCV20) 01/25/2021   INFLUENZA VACCINE  12/25/2021   TETANUS/TDAP  01/25/2030   Hepatitis C Screening  Completed   Zoster Vaccines- Shingrix  Completed   HPV VACCINES  Aged Out    Health Maintenance  Health Maintenance Due  Topic Date Due   COLONOSCOPY (Pts 45-468yrInsurance coverage will need to be confirmed)  03/26/2020   COVID-19 Vaccine (4 -  Moderna series) 07/31/2020   Pneumonia Vaccine 14+ Years old (2 - PPSV23 or PCV20) 01/25/2021   INFLUENZA VACCINE   12/25/2021    Colorectal cancer screening: Type of screening: Colonoscopy. Completed 06/14/2021. Repeat every 5 years  Lung Cancer Screening: (Low Dose CT Chest recommended if Age 68-80 years, 30 pack-year currently smoking OR have quit w/in 15years.) does not qualify.   Lung Cancer Screening Referral: no  Additional Screening:  Hepatitis C Screening: does qualify; Completed 09/16/2018  Vision Screening: Recommended annual ophthalmology exams for early detection of glaucoma and other disorders of the eye. Is the patient up to date with their annual eye exam?  Yes  Who is the provider or what is the name of the office in which the patient attends annual eye exams?  If pt is not established with a provider, would they like to be referred to a provider to establish care? No .   Dental Screening: Recommended annual dental exams for proper oral hygiene  Community Resource Referral / Chronic Care Management: CRR required this visit?  No   CCM required this visit?  No      Plan:     I have personally reviewed and noted the following in the patient's chart:   Medical and social history Use of alcohol, tobacco or illicit drugs  Current medications and supplements including opioid prescriptions. Patient is not currently taking opioid prescriptions. Functional ability and status Nutritional status Physical activity Advanced directives List of other physicians Hospitalizations, surgeries, and ER visits in previous 12 months Vitals Screenings to include cognitive, depression, and falls Referrals and appointments  In addition, I have reviewed and discussed with patient certain preventive protocols, quality metrics, and best practice recommendations. A written personalized care plan for preventive services as well as general preventive health recommendations were provided to patient.     Kellie Simmering, LPN   09/01/2498   Nurse Notes: none  Due to this being a virtual visit, the  after visit summary with patients personalized plan was offered to patient via mail or my-chart. Patient would like to access on my-chart

## 2022-01-31 DIAGNOSIS — I10 Essential (primary) hypertension: Secondary | ICD-10-CM | POA: Diagnosis not present

## 2022-01-31 DIAGNOSIS — I480 Paroxysmal atrial fibrillation: Secondary | ICD-10-CM | POA: Diagnosis not present

## 2022-02-01 ENCOUNTER — Encounter: Payer: Medicare PPO | Admitting: Family Medicine

## 2022-03-17 DIAGNOSIS — I4811 Longstanding persistent atrial fibrillation: Secondary | ICD-10-CM | POA: Diagnosis not present

## 2022-03-17 DIAGNOSIS — J209 Acute bronchitis, unspecified: Secondary | ICD-10-CM | POA: Diagnosis not present

## 2022-03-17 DIAGNOSIS — I10 Essential (primary) hypertension: Secondary | ICD-10-CM | POA: Diagnosis not present

## 2022-04-11 DIAGNOSIS — R972 Elevated prostate specific antigen [PSA]: Secondary | ICD-10-CM | POA: Diagnosis not present

## 2022-04-11 DIAGNOSIS — R739 Hyperglycemia, unspecified: Secondary | ICD-10-CM | POA: Diagnosis not present

## 2022-04-11 DIAGNOSIS — I10 Essential (primary) hypertension: Secondary | ICD-10-CM | POA: Diagnosis not present

## 2022-04-11 DIAGNOSIS — E782 Mixed hyperlipidemia: Secondary | ICD-10-CM | POA: Diagnosis not present

## 2022-06-19 DIAGNOSIS — I4811 Longstanding persistent atrial fibrillation: Secondary | ICD-10-CM | POA: Diagnosis not present

## 2022-06-19 DIAGNOSIS — I10 Essential (primary) hypertension: Secondary | ICD-10-CM | POA: Diagnosis not present

## 2022-06-19 DIAGNOSIS — G4733 Obstructive sleep apnea (adult) (pediatric): Secondary | ICD-10-CM | POA: Diagnosis not present

## 2022-08-09 DIAGNOSIS — G4733 Obstructive sleep apnea (adult) (pediatric): Secondary | ICD-10-CM | POA: Diagnosis not present

## 2022-10-03 DIAGNOSIS — H2513 Age-related nuclear cataract, bilateral: Secondary | ICD-10-CM | POA: Diagnosis not present

## 2022-10-10 ENCOUNTER — Encounter: Payer: Medicare PPO | Admitting: Family Medicine

## 2023-01-09 ENCOUNTER — Ambulatory Visit: Payer: Medicare PPO | Admitting: Family Medicine

## 2023-01-09 ENCOUNTER — Encounter (INDEPENDENT_AMBULATORY_CARE_PROVIDER_SITE_OTHER): Payer: Self-pay

## 2023-02-13 ENCOUNTER — Telehealth: Payer: Self-pay | Admitting: Family Medicine

## 2023-02-13 ENCOUNTER — Encounter: Payer: Self-pay | Admitting: Family Medicine

## 2023-02-13 ENCOUNTER — Ambulatory Visit: Payer: Medicare PPO | Admitting: Family Medicine

## 2023-02-13 VITALS — BP 142/90 | HR 72 | Temp 97.9°F | Ht 71.0 in | Wt 260.0 lb

## 2023-02-13 DIAGNOSIS — M7671 Peroneal tendinitis, right leg: Secondary | ICD-10-CM | POA: Diagnosis not present

## 2023-02-13 DIAGNOSIS — I10 Essential (primary) hypertension: Secondary | ICD-10-CM | POA: Diagnosis not present

## 2023-02-13 DIAGNOSIS — Z23 Encounter for immunization: Secondary | ICD-10-CM

## 2023-02-13 DIAGNOSIS — G4733 Obstructive sleep apnea (adult) (pediatric): Secondary | ICD-10-CM

## 2023-02-13 DIAGNOSIS — I480 Paroxysmal atrial fibrillation: Secondary | ICD-10-CM

## 2023-02-13 DIAGNOSIS — B351 Tinea unguium: Secondary | ICD-10-CM | POA: Diagnosis not present

## 2023-02-13 DIAGNOSIS — Z7189 Other specified counseling: Secondary | ICD-10-CM

## 2023-02-13 DIAGNOSIS — Z0000 Encounter for general adult medical examination without abnormal findings: Secondary | ICD-10-CM

## 2023-02-13 DIAGNOSIS — M79671 Pain in right foot: Secondary | ICD-10-CM | POA: Diagnosis not present

## 2023-02-13 LAB — COMPREHENSIVE METABOLIC PANEL
ALT: 44 U/L (ref 0–53)
AST: 30 U/L (ref 0–37)
Albumin: 4.1 g/dL (ref 3.5–5.2)
Alkaline Phosphatase: 72 U/L (ref 39–117)
BUN: 17 mg/dL (ref 6–23)
CO2: 27 mEq/L (ref 19–32)
Calcium: 9.3 mg/dL (ref 8.4–10.5)
Chloride: 104 mEq/L (ref 96–112)
Creatinine, Ser: 0.92 mg/dL (ref 0.40–1.50)
GFR: 85.04 mL/min (ref >60.00)
Glucose, Bld: 89 mg/dL (ref 70–99)
Potassium: 4.4 mEq/L (ref 3.5–5.1)
Sodium: 139 mEq/L (ref 135–145)
Total Bilirubin: 0.7 mg/dL (ref 0.2–1.2)
Total Protein: 7.3 g/dL (ref 6.0–8.3)

## 2023-02-13 LAB — CBC WITH DIFFERENTIAL/PLATELET
Basophils Absolute: 0.1 10*3/uL (ref 0.0–0.1)
Basophils Relative: 0.7 % (ref 0.0–3.0)
Eosinophils Absolute: 0.2 10*3/uL (ref 0.0–0.7)
Eosinophils Relative: 2.3 % (ref 0.0–5.0)
HCT: 48.3 % (ref 39.0–52.0)
Hemoglobin: 15.9 g/dL (ref 13.0–17.0)
Lymphocytes Relative: 31.7 % (ref 12.0–46.0)
Lymphs Abs: 2.6 10*3/uL (ref 0.7–4.0)
MCHC: 32.9 g/dL (ref 30.0–36.0)
MCV: 93 fl (ref 78.0–100.0)
Monocytes Absolute: 0.9 10*3/uL (ref 0.1–1.0)
Monocytes Relative: 11.1 % (ref 3.0–12.0)
Neutro Abs: 4.5 10*3/uL (ref 1.4–7.7)
Neutrophils Relative %: 54.2 % (ref 43.0–77.0)
Platelets: 220 10*3/uL (ref 150.0–400.0)
RBC: 5.19 Mil/uL (ref 4.22–5.81)
RDW: 14.8 % (ref 11.5–15.5)
WBC: 8.3 10*3/uL (ref 4.0–10.5)

## 2023-02-13 LAB — LIPID PANEL
Cholesterol: 196 mg/dL (ref 0–200)
HDL: 47 mg/dL (ref >39.00)
LDL Cholesterol: 119 mg/dL — ABNORMAL HIGH (ref 0–99)
NonHDL: 149.22
Total CHOL/HDL Ratio: 4
Triglycerides: 151 mg/dL — ABNORMAL HIGH (ref 0.0–149.0)
VLDL: 30.2 mg/dL (ref 0.0–40.0)

## 2023-02-13 LAB — TSH: TSH: 2.16 u[IU]/mL (ref 0.35–5.50)

## 2023-02-13 NOTE — Patient Instructions (Addendum)
Take care.  Glad to see you. Update me as needed.  Go to the lab on the way out.   If you have mychart we'll likely use that to update you.     Check your BP a few times and then take your cuff to the cardiology appointment.

## 2023-02-13 NOTE — Progress Notes (Signed)
I have personally reviewed the Medicare Annual Wellness questionnaire and have noted 1. The patient's medical and social history 2. Their use of alcohol, tobacco or illicit drugs 3. Their current medications and supplements 4. The patient's functional ability including ADL's, fall risks, home safety risks and hearing or visual             impairment. 5. Diet and physical activities 6. Evidence for depression or mood disorders  The patients weight, height, BMI have been recorded in the chart and visual acuity is per eye clinic.  I have made referrals, counseling and provided education to the patient based review of the above and I have provided the pt with a written personalized care plan for preventive services.  Provider list updated- see scanned forms.  Routine anticipatory guidance given to patient.  See health maintenance. The possibility exists that previously documented standard health maintenance information may have been brought forward from a previous encounter into this note.  If needed, that same information has been updated to reflect the current situation based on today's encounter.    Flu 2024 Shingles previously done PNA previously done Tetanus 2021 COVID-vaccine previously done Colonoscopy 06/14/21.   Prostate cancer screening and PSA options (with potential risks and benefits of testing vs not testing) were discussed along with recent recs/guidelines.  He declined testing PSA at this point. Advance directive-wife designated if patient were incapacitated. Cognitive function addressed- see scanned forms- and if abnormal then additional documentation follows.   In addition to [facility], follow up visit for the below conditions:  Hypertension:    Using medication without problems or lightheadedness: yes Chest pain with exertion:no Edema:no Short of breath:no I asked him to check his blood pressure a few times at home and then take his blood pressure cuff to  cardiology to calibrate his cuff.  AF.  No heart racing.  No syncope.  No bleeding.  Compliant with diltiazem Eliquis and losartan.  He doesn't feel skipped beats.  Still using CPAP for OSA.  Sleeps better with CPAP.  D/w pt.    Diet and exercise d/w pt.    Labs pending.  Discussed with patient about potentially using Ozempic along with diet and exercise.  See notes on labs.  He is noted occasional left thigh pain along the quad during the last month but without any claudication.  PMH and SH reviewed  Meds, vitals, and allergies reviewed.   ROS: Per HPI.  Unless specifically indicated otherwise in HPI, the patient denies:  General: fever. Eyes: acute vision changes ENT: sore throat Cardiovascular: chest pain Respiratory: SOB GI: vomiting GU: dysuria Musculoskeletal: acute back pain Derm: acute rash Neuro: acute motor dysfunction Psych: worsening mood Endocrine: polydipsia Heme: bleeding Allergy: hayfever  GEN: nad, alert and oriented HEENT: mucous membranes moist NECK: supple w/o LA CV: rrr. PULM: ctab, no inc wob ABD: soft, +bs EXT: no edema SKIN: well perfused.

## 2023-02-13 NOTE — Telephone Encounter (Signed)
Pt called stating he forgot to ask Para March during cpe on today, 9/19, about advice on what can he do about a lot of skin tags under his arms? Call back # [phone]

## 2023-02-14 NOTE — Telephone Encounter (Signed)
Recommendations given to patient.

## 2023-02-14 NOTE — Telephone Encounter (Signed)
Could try OTC skin tag removal patches.  We can take them off at OV if needed.

## 2023-02-16 DIAGNOSIS — Z0000 Encounter for general adult medical examination without abnormal findings: Secondary | ICD-10-CM | POA: Insufficient documentation

## 2023-02-16 NOTE — Assessment & Plan Note (Signed)
Still using CPAP for OSA.  Sleeps better with CPAP.  D/w pt about continued use.

## 2023-02-16 NOTE — Assessment & Plan Note (Signed)
Advance directive- wife designated if patient were incapacitated.

## 2023-02-16 NOTE — Assessment & Plan Note (Signed)
Compliant with diltiazem Eliquis and losartan.  He doesn't feel skipped beats.  Continue as is.

## 2023-02-16 NOTE — Assessment & Plan Note (Signed)
Continue diltiazem and losartan

## 2023-02-16 NOTE — Assessment & Plan Note (Signed)
Flu 2024 Shingles previously done PNA previously done Tetanus 2021 COVID-vaccine previously done Colonoscopy 06/14/21.   Prostate cancer screening and PSA options (with potential risks and benefits of testing vs not testing) were discussed along with recent recs/guidelines.  He declined testing PSA at this point. Advance directive-wife designated if patient were incapacitated. Cognitive function addressed- see scanned forms- and if abnormal then additional documentation follows.

## 2023-03-27 DIAGNOSIS — Z79899 Other long term (current) drug therapy: Secondary | ICD-10-CM | POA: Diagnosis not present

## 2023-03-27 DIAGNOSIS — Z7901 Long term (current) use of anticoagulants: Secondary | ICD-10-CM | POA: Diagnosis not present

## 2023-03-27 DIAGNOSIS — M79602 Pain in left arm: Secondary | ICD-10-CM | POA: Diagnosis not present

## 2023-03-27 DIAGNOSIS — R079 Chest pain, unspecified: Secondary | ICD-10-CM | POA: Diagnosis not present

## 2023-03-27 DIAGNOSIS — R0602 Shortness of breath: Secondary | ICD-10-CM | POA: Diagnosis not present

## 2023-03-27 DIAGNOSIS — I214 Non-ST elevation (NSTEMI) myocardial infarction: Secondary | ICD-10-CM | POA: Diagnosis not present

## 2023-03-27 DIAGNOSIS — I480 Paroxysmal atrial fibrillation: Secondary | ICD-10-CM | POA: Diagnosis not present

## 2023-03-27 DIAGNOSIS — R0789 Other chest pain: Secondary | ICD-10-CM | POA: Diagnosis not present

## 2023-03-27 DIAGNOSIS — I10 Essential (primary) hypertension: Secondary | ICD-10-CM | POA: Diagnosis not present

## 2023-03-28 DIAGNOSIS — E785 Hyperlipidemia, unspecified: Secondary | ICD-10-CM | POA: Diagnosis not present

## 2023-03-28 DIAGNOSIS — R739 Hyperglycemia, unspecified: Secondary | ICD-10-CM | POA: Diagnosis not present

## 2023-03-28 DIAGNOSIS — E871 Hypo-osmolality and hyponatremia: Secondary | ICD-10-CM | POA: Diagnosis not present

## 2023-03-28 DIAGNOSIS — I081 Rheumatic disorders of both mitral and tricuspid valves: Secondary | ICD-10-CM | POA: Diagnosis not present

## 2023-03-28 DIAGNOSIS — I2510 Atherosclerotic heart disease of native coronary artery without angina pectoris: Secondary | ICD-10-CM | POA: Diagnosis not present

## 2023-03-28 DIAGNOSIS — I480 Paroxysmal atrial fibrillation: Secondary | ICD-10-CM | POA: Diagnosis not present

## 2023-03-28 DIAGNOSIS — Z7901 Long term (current) use of anticoagulants: Secondary | ICD-10-CM | POA: Diagnosis not present

## 2023-03-28 DIAGNOSIS — R9439 Abnormal result of other cardiovascular function study: Secondary | ICD-10-CM | POA: Diagnosis not present

## 2023-03-28 DIAGNOSIS — I214 Non-ST elevation (NSTEMI) myocardial infarction: Secondary | ICD-10-CM | POA: Diagnosis not present

## 2023-03-28 DIAGNOSIS — I10 Essential (primary) hypertension: Secondary | ICD-10-CM | POA: Diagnosis not present

## 2023-03-28 DIAGNOSIS — I219 Acute myocardial infarction, unspecified: Secondary | ICD-10-CM | POA: Diagnosis not present

## 2023-03-28 DIAGNOSIS — G4733 Obstructive sleep apnea (adult) (pediatric): Secondary | ICD-10-CM | POA: Diagnosis not present

## 2023-04-03 DIAGNOSIS — I081 Rheumatic disorders of both mitral and tricuspid valves: Secondary | ICD-10-CM | POA: Diagnosis not present

## 2023-04-03 DIAGNOSIS — Z955 Presence of coronary angioplasty implant and graft: Secondary | ICD-10-CM | POA: Diagnosis not present

## 2023-04-03 DIAGNOSIS — I2510 Atherosclerotic heart disease of native coronary artery without angina pectoris: Secondary | ICD-10-CM | POA: Diagnosis not present

## 2023-04-03 DIAGNOSIS — E785 Hyperlipidemia, unspecified: Secondary | ICD-10-CM | POA: Diagnosis not present

## 2023-04-03 DIAGNOSIS — I480 Paroxysmal atrial fibrillation: Secondary | ICD-10-CM | POA: Diagnosis not present

## 2023-04-03 DIAGNOSIS — Z7901 Long term (current) use of anticoagulants: Secondary | ICD-10-CM | POA: Diagnosis not present

## 2023-04-03 DIAGNOSIS — G4733 Obstructive sleep apnea (adult) (pediatric): Secondary | ICD-10-CM | POA: Diagnosis not present

## 2023-04-03 DIAGNOSIS — E669 Obesity, unspecified: Secondary | ICD-10-CM | POA: Diagnosis not present

## 2023-04-03 DIAGNOSIS — I10 Essential (primary) hypertension: Secondary | ICD-10-CM | POA: Diagnosis not present

## 2023-04-30 DIAGNOSIS — Z955 Presence of coronary angioplasty implant and graft: Secondary | ICD-10-CM | POA: Diagnosis not present

## 2023-05-09 DIAGNOSIS — G4733 Obstructive sleep apnea (adult) (pediatric): Secondary | ICD-10-CM | POA: Diagnosis not present

## 2023-06-02 DIAGNOSIS — Z955 Presence of coronary angioplasty implant and graft: Secondary | ICD-10-CM | POA: Diagnosis not present

## 2023-06-03 DIAGNOSIS — Z955 Presence of coronary angioplasty implant and graft: Secondary | ICD-10-CM | POA: Diagnosis not present

## 2023-06-05 DIAGNOSIS — Z955 Presence of coronary angioplasty implant and graft: Secondary | ICD-10-CM | POA: Diagnosis not present

## 2023-06-09 DIAGNOSIS — Z955 Presence of coronary angioplasty implant and graft: Secondary | ICD-10-CM | POA: Diagnosis not present

## 2023-06-10 ENCOUNTER — Telehealth: Payer: Self-pay

## 2023-06-10 DIAGNOSIS — Z955 Presence of coronary angioplasty implant and graft: Secondary | ICD-10-CM | POA: Diagnosis not present

## 2023-06-10 NOTE — Telephone Encounter (Signed)
  Spoke with patient advised not received    Copied from CRM [id number]. Topic: Clinical - Request for Lab/Test Order >> Jun 10, 2023  2:06 PM Isabell A wrote: Reason for CRM: Patient would like to confirm if his cardiologist sent over a request for fasting cholesterol labs.

## 2023-06-12 DIAGNOSIS — Z955 Presence of coronary angioplasty implant and graft: Secondary | ICD-10-CM | POA: Diagnosis not present

## 2023-06-13 NOTE — Telephone Encounter (Signed)
Copied from CRM [id number]. Topic: Clinical - Request for Lab/Test Order >> Jun 13, 2023  3:46 PM Clayton Bibles wrote: Reason for CRM: Patient would like lab orders to be sent to Trish Mage at fax # [phone]. Please call him with questions.

## 2023-06-15 NOTE — Telephone Encounter (Signed)
Please send orders for CMET/lipid/CBC/TSH, Dx. HTN I10.    Thanks.

## 2023-06-16 ENCOUNTER — Other Ambulatory Visit: Payer: Self-pay

## 2023-06-16 DIAGNOSIS — I10 Essential (primary) hypertension: Secondary | ICD-10-CM

## 2023-06-16 NOTE — Telephone Encounter (Signed)
Orders placed and faxed as requested.

## 2023-06-17 DIAGNOSIS — Z955 Presence of coronary angioplasty implant and graft: Secondary | ICD-10-CM | POA: Diagnosis not present

## 2023-06-17 NOTE — Telephone Encounter (Signed)
Lab orders have been refaxed to number requested.  Patient notified.

## 2023-06-17 NOTE — Telephone Encounter (Signed)
Copied from CRM [id number]. Topic: Clinical - Request for Lab/Test Order >> Jun 17, 2023  1:47 PM Corin V wrote: Reason for CRM: Patient is calling stating that as of today the lab orders have still not been received by Dr. Burnell Blanks Johnson's office. Please refax to [phone] and let patient know when they have been resent.

## 2023-06-23 DIAGNOSIS — Z955 Presence of coronary angioplasty implant and graft: Secondary | ICD-10-CM | POA: Diagnosis not present

## 2023-06-24 DIAGNOSIS — Z955 Presence of coronary angioplasty implant and graft: Secondary | ICD-10-CM | POA: Diagnosis not present

## 2023-06-25 DIAGNOSIS — Z1329 Encounter for screening for other suspected endocrine disorder: Secondary | ICD-10-CM | POA: Diagnosis not present

## 2023-06-25 DIAGNOSIS — I10 Essential (primary) hypertension: Secondary | ICD-10-CM | POA: Diagnosis not present

## 2023-06-26 DIAGNOSIS — Z955 Presence of coronary angioplasty implant and graft: Secondary | ICD-10-CM | POA: Diagnosis not present

## 2023-06-30 DIAGNOSIS — Z955 Presence of coronary angioplasty implant and graft: Secondary | ICD-10-CM | POA: Diagnosis not present

## 2023-07-01 DIAGNOSIS — Z955 Presence of coronary angioplasty implant and graft: Secondary | ICD-10-CM | POA: Diagnosis not present

## 2023-07-03 DIAGNOSIS — R0602 Shortness of breath: Secondary | ICD-10-CM | POA: Diagnosis not present

## 2023-07-03 DIAGNOSIS — Z79899 Other long term (current) drug therapy: Secondary | ICD-10-CM | POA: Diagnosis not present

## 2023-07-03 DIAGNOSIS — E860 Dehydration: Secondary | ICD-10-CM | POA: Diagnosis not present

## 2023-07-03 DIAGNOSIS — E861 Hypovolemia: Secondary | ICD-10-CM | POA: Diagnosis not present

## 2023-07-03 DIAGNOSIS — I444 Left anterior fascicular block: Secondary | ICD-10-CM | POA: Diagnosis not present

## 2023-07-03 DIAGNOSIS — Z7901 Long term (current) use of anticoagulants: Secondary | ICD-10-CM | POA: Diagnosis not present

## 2023-07-03 DIAGNOSIS — I4891 Unspecified atrial fibrillation: Secondary | ICD-10-CM | POA: Diagnosis not present

## 2023-07-03 DIAGNOSIS — I10 Essential (primary) hypertension: Secondary | ICD-10-CM | POA: Diagnosis not present

## 2023-07-04 DIAGNOSIS — R0602 Shortness of breath: Secondary | ICD-10-CM | POA: Diagnosis not present

## 2023-07-04 DIAGNOSIS — R079 Chest pain, unspecified: Secondary | ICD-10-CM | POA: Diagnosis not present

## 2023-07-07 DIAGNOSIS — Z955 Presence of coronary angioplasty implant and graft: Secondary | ICD-10-CM | POA: Diagnosis not present

## 2023-07-08 DIAGNOSIS — Z955 Presence of coronary angioplasty implant and graft: Secondary | ICD-10-CM | POA: Diagnosis not present

## 2023-07-09 DIAGNOSIS — I480 Paroxysmal atrial fibrillation: Secondary | ICD-10-CM | POA: Diagnosis not present

## 2023-07-09 DIAGNOSIS — I340 Nonrheumatic mitral (valve) insufficiency: Secondary | ICD-10-CM | POA: Diagnosis not present

## 2023-07-09 DIAGNOSIS — G4733 Obstructive sleep apnea (adult) (pediatric): Secondary | ICD-10-CM | POA: Diagnosis not present

## 2023-07-09 DIAGNOSIS — E785 Hyperlipidemia, unspecified: Secondary | ICD-10-CM | POA: Diagnosis not present

## 2023-07-09 DIAGNOSIS — E66812 Obesity, class 2: Secondary | ICD-10-CM | POA: Diagnosis not present

## 2023-07-09 DIAGNOSIS — I2510 Atherosclerotic heart disease of native coronary artery without angina pectoris: Secondary | ICD-10-CM | POA: Diagnosis not present

## 2023-07-09 DIAGNOSIS — I10 Essential (primary) hypertension: Secondary | ICD-10-CM | POA: Diagnosis not present

## 2023-07-09 DIAGNOSIS — Z7901 Long term (current) use of anticoagulants: Secondary | ICD-10-CM | POA: Diagnosis not present

## 2023-07-21 DIAGNOSIS — Z955 Presence of coronary angioplasty implant and graft: Secondary | ICD-10-CM | POA: Diagnosis not present

## 2023-07-22 DIAGNOSIS — M25562 Pain in left knee: Secondary | ICD-10-CM | POA: Diagnosis not present

## 2023-07-22 DIAGNOSIS — M25561 Pain in right knee: Secondary | ICD-10-CM | POA: Diagnosis not present

## 2023-07-24 DIAGNOSIS — Z955 Presence of coronary angioplasty implant and graft: Secondary | ICD-10-CM | POA: Diagnosis not present

## 2023-07-28 DIAGNOSIS — Z955 Presence of coronary angioplasty implant and graft: Secondary | ICD-10-CM | POA: Diagnosis not present

## 2023-07-29 DIAGNOSIS — Z955 Presence of coronary angioplasty implant and graft: Secondary | ICD-10-CM | POA: Diagnosis not present

## 2023-08-04 DIAGNOSIS — I480 Paroxysmal atrial fibrillation: Secondary | ICD-10-CM | POA: Diagnosis not present

## 2023-08-15 DIAGNOSIS — I480 Paroxysmal atrial fibrillation: Secondary | ICD-10-CM | POA: Diagnosis not present

## 2023-09-20 DIAGNOSIS — G4733 Obstructive sleep apnea (adult) (pediatric): Secondary | ICD-10-CM | POA: Diagnosis not present

## 2023-09-25 DIAGNOSIS — I480 Paroxysmal atrial fibrillation: Secondary | ICD-10-CM | POA: Diagnosis not present

## 2023-09-25 DIAGNOSIS — I10 Essential (primary) hypertension: Secondary | ICD-10-CM | POA: Diagnosis not present

## 2023-11-22 DIAGNOSIS — M791 Myalgia, unspecified site: Secondary | ICD-10-CM | POA: Diagnosis not present

## 2023-11-22 DIAGNOSIS — W57XXXA Bitten or stung by nonvenomous insect and other nonvenomous arthropods, initial encounter: Secondary | ICD-10-CM | POA: Diagnosis not present

## 2023-11-22 DIAGNOSIS — R6889 Other general symptoms and signs: Secondary | ICD-10-CM | POA: Diagnosis not present

## 2023-11-22 DIAGNOSIS — R509 Fever, unspecified: Secondary | ICD-10-CM | POA: Diagnosis not present

## 2023-12-14 DIAGNOSIS — J329 Chronic sinusitis, unspecified: Secondary | ICD-10-CM | POA: Diagnosis not present

## 2023-12-14 DIAGNOSIS — J40 Bronchitis, not specified as acute or chronic: Secondary | ICD-10-CM | POA: Diagnosis not present

## 2023-12-14 NOTE — Progress Notes (Signed)
 ABUNDIO Christopher Elliott DOB: [birth year] Date:  12/14/2023   HPI:  Christopher Elliott is a 68 y.o., Male who presents with the chief complaint of  Chief Complaint  Patient presents with  . Cough    Has cough that is productive at times.  Feels short of breath, body aches for about 1-2 weeks.  Never fully healed from last illness in June.  .   Patient with productive cough, yellow sputum being produced over the past several days.  Some body aches and headache noted.  Chest feels very congested.    Knee pain is feeling much better since last visit.  Patient feels he does not need to be retested for Lyme at this time.   Reviewed patients medications, allergies, past medical, surgical, and social history     Vital signs:  BP (!) 160/81   Pulse 71   Temp 98.1 F (36.7 C) (Oral)   Resp 19   SpO2 96%    Allergies[1]  Medications:   Current Outpatient Medications  Medication Sig Dispense Refill  . albuterol HFA (PROVENTIL HFA;VENTOLIN HFA) 90 mcg/actuation Inhalation HFA Aerosol Inhaler Take 1 Puff by inhalation every 4 hours as needed for Wheezing. 16 g 1  . amoxicillin-pot clavulanate (AUGMENTIN) 875-125 mg Oral Tablet Take 1 Tablet by mouth twice a day with meals for 7 days. 14 Tablet 0  . apixaban  (ELIQUIS ) 5 mg Oral Tablet Take 1 Tablet by mouth twice a day. For management of Nonvalvular Atrial Fibrillation 60 Tablet 0  . atorvastatin (LIPITOR) 80 mg Oral Tablet Take 1 Tablet by mouth daily.    . clopidogreL (PLAVIX) 75 mg Oral Tablet Take 1 Tablet by mouth daily.    SABRA dexAMETHasone (DECADRON) 4 mg Oral Tablet Take 1 Tablet by mouth daily for 5 days. 5 Tablet 0  . dilTIAZem  (CARDIZEM ) 90 mg Oral Tablet Take 1 Tablet by mouth twice a day.    . famotidine (PEPCID) 20 mg Oral Tablet Take 1 Tablet by mouth twice a day.    . hydrocodone-chlorpheniramine (TUSSIONEX PENNKINETIC) 10-8 mg/5 mL Oral Suspension, Sust. Release 12HR Take 5 mL by mouth every 12 hours as needed for Cough. 100 mL 0  .  isosorbide mononitrate (IMDUR) 30 mg Oral Tablet Sustained Release 24HR 30 mg = 1 tab, Oral, every morning, # 30 tab, 0 Refill(s), Pharmacy: [id number]    . losartan (COZAAR) 50 mg Oral Tablet Take 1 Tablet by mouth daily.     No current facility-administered medications for this visit.    Review of Systems:   Review of Systems see HPI   Physical Exam:   Physical Exam Vitals and nursing note reviewed.  Constitutional:      General: He is not in acute distress.    Appearance: Normal appearance. He is not ill-appearing.  HENT:     Right Ear: Tympanic membrane normal.     Left Ear: Tympanic membrane normal.     Nose: Congestion present. No rhinorrhea.     Mouth/Throat:     Mouth: Mucous membranes are moist.     Pharynx: No oropharyngeal exudate or posterior oropharyngeal erythema.  Eyes:     Conjunctiva/sclera: Conjunctivae normal.  Cardiovascular:     Rate and Rhythm: Normal rate and regular rhythm.     Pulses: Normal pulses.     Heart sounds: Normal heart sounds.  Pulmonary:     Effort: Pulmonary effort is normal.     Breath sounds: Wheezing and rhonchi present.  Skin:  General: Skin is warm.     Coloration: Skin is not pale.     Findings: No rash.  Neurological:     Mental Status: He is alert.  Psychiatric:        Mood and Affect: Mood normal.        Behavior: Behavior normal.     Lab:  Radiology:   Assessment/Plan:  Problem List Items Addressed This Visit   None Visit Diagnoses       Sinobronchitis    -  Primary   Relevant Medications   albuterol HFA (PROVENTIL HFA;VENTOLIN HFA) 90 mcg/actuation Inhalation HFA Aerosol Inhaler   dexAMETHasone (DECADRON) 4 mg Oral Tablet   amoxicillin-pot clavulanate (AUGMENTIN) 875-125 mg Oral Tablet   hydrocodone-chlorpheniramine (TUSSIONEX PENNKINETIC) 10-8 mg/5 mL Oral Suspension, Sust. Release 12HR       An educational brochure was provided to the patient today outlining details of the condition  including treatment options and home care.  If symptoms persist they are encouraged to seek medical care with PCP, U[facility], or ED.   Newell Bumpers, PA-C     [1] No Known Allergies

## 2023-12-22 ENCOUNTER — Telehealth: Payer: Self-pay | Admitting: *Deleted

## 2023-12-22 NOTE — Telephone Encounter (Signed)
 Did you by chance call Mr. Ricketson.

## 2023-12-22 NOTE — Telephone Encounter (Signed)
 Copied from CRM [id number]. Topic: General - Call Back - No Documentation >> Dec 22, 2023  3:49 PM Burnard DEL wrote: Reason for CRM: Patient called in sating that someone named Grayce tried to contact him form the office.There was no documentation as to who tried to contact the  patient

## 2023-12-23 NOTE — Telephone Encounter (Signed)
 Copied from CRM [id number]. Topic: General - Inquiry >> Dec 23, 2023  8:02 AM Laymon HERO wrote: Reason for CRM: patient asking for Dyane Rima B to return his call when available.

## 2023-12-24 ENCOUNTER — Ambulatory Visit

## 2023-12-24 ENCOUNTER — Ambulatory Visit (INDEPENDENT_AMBULATORY_CARE_PROVIDER_SITE_OTHER)

## 2023-12-24 VITALS — Ht 71.0 in | Wt 244.0 lb

## 2023-12-24 DIAGNOSIS — Z0000 Encounter for general adult medical examination without abnormal findings: Secondary | ICD-10-CM

## 2023-12-24 NOTE — Progress Notes (Signed)
 Please attest and cosign this visit due to patients primary care provider not being in the office at the time the visit was completed.    Subjective:   Christopher Elliott. is a 68 y.o. who presents for a Medicare Wellness preventive visit.  As a reminder, Annual Wellness Visits don't include a physical exam, and some assessments may be limited, especially if this visit is performed virtually. We may recommend an in-person follow-up visit with your provider if needed.  Visit Complete: Virtual I connected with  Christopher Elliott. on 12/24/23 by a audio enabled telemedicine application and verified that I am speaking with the correct person using two identifiers.  Patient Location: Home  Provider Location: Office/Clinic  I discussed the limitations of evaluation and management by telemedicine. The patient expressed understanding and agreed to proceed.  Vital Signs: Because this visit was a virtual/telehealth visit, some criteria may be missing or patient reported. Any vitals not documented were not able to be obtained and vitals that have been documented are patient reported.  VideoDeclined- This patient declined Librarian, academic. Therefore the visit was completed with audio only.  Persons Participating in Visit: Patient.  AWV Questionnaire: No: Patient Medicare AWV questionnaire was not completed prior to this visit.  Cardiac Risk Factors include: advanced age (>18men, >51 women);male gender;hypertension;obesity (BMI >30kg/m2)     Objective:    Today's Vitals   12/24/23 1354  Weight: 244 lb (110.7 kg)  Height: 5' 11 (1.803 m)   Body mass index is 34.03 kg/m.     12/24/2023    2:06 PM 12/28/2021   10:44 AM 10/13/2020    9:01 AM 03/27/2015    7:37 AM  Advanced Directives  Does Patient Have a Medical Advance Directive? Yes Yes Yes No   Type of Estate agent of [place];Living will Healthcare Power of [place];Living will Healthcare  Power of [place];Living will   Copy of Healthcare Power of Attorney in Chart? No - copy requested No - copy requested No - copy requested   Would patient like information on creating a medical advance directive?    No - patient declined information      Data saved with a previous flowsheet row definition    Current Medications (verified) Outpatient Encounter Medications as of 12/24/2023  Medication Sig   apixaban  (ELIQUIS ) 5 MG TABS tablet Take 1 tablet (5 mg total) by mouth 2 (two) times daily.   atorvastatin (LIPITOR) 80 MG tablet Take 80 mg by mouth daily.   clopidogrel (PLAVIX) 75 MG tablet Take 75 mg by mouth daily.   diltiazem  (CARDIZEM ) 120 MG tablet Take 1 tablet (120 mg total) by mouth daily.   famotidine (PEPCID) 20 MG tablet Take 20 mg by mouth 2 (two) times daily.   isosorbide mononitrate (IMDUR) 30 MG 24 hr tablet Take 30 mg by mouth daily.   losartan (COZAAR) 50 MG tablet Take 50 mg by mouth daily.   No facility-administered encounter medications on file as of 12/24/2023.    Allergies (verified) Patient has no known allergies.   History: Past Medical History:  Diagnosis Date   Atrial fibrillation (HCC)    Hypertension    LVH (left ventricular hypertrophy)    Noted on echo 2019.   Sleep apnea    Syncope    Past Surgical History:  Procedure Laterality Date   BACK SURGERY     COLONOSCOPY WITH PROPOFOL  N/A 03/27/2015   Procedure: COLONOSCOPY WITH PROPOFOL ;  Surgeon:  Donnice Vaughn Manes, MD;  Location: [facility] ENDOSCOPY;  Service: Endoscopy;  Laterality: N/A;   TONSILLECTOMY     Family History  Problem Relation Age of Onset   Diabetes Mother    Lung cancer Father    Cancer Father    Colon cancer Neg Hx    Prostate cancer Neg Hx    Social History   Socioeconomic History   Marital status: Married    Spouse name: Not on file   Number of children: Not on file   Years of education: Not on file   Highest education level: Not on file  Occupational History   Not  on file  Tobacco Use   Smoking status: Never   Smokeless tobacco: Former  Advertising account planner   Vaping status: Never Used  Substance and Sexual Activity   Alcohol use: Yes    Alcohol/week: 16.0 standard drinks of alcohol    Types: 8 Cans of beer, 8 Shots of liquor per week    Comment: rare   Drug use: Never   Sexual activity: Yes  Other Topics Concern   Not on file  Social History Narrative   Married 1976   3 kids, none local.  7 grandkids.   College graduate.     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  Works from home as of 2022.     [place] .  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  No known concussions.   [place]  [place] fan.   Social Drivers of Corporate investment banker Strain: Low Risk  (12/24/2023)   Overall Financial Resource Strain (CARDIA)    Difficulty of Paying Living Expenses: Not hard at all  Food Insecurity: No Food Insecurity (12/24/2023)   Hunger Vital Sign    Worried About Running Out of Food in the Last Year: Never true    Ran Out of Food in the Last Year: Never true  Transportation Needs: No Transportation Needs (12/24/2023)   PRAPARE - Administrator, Civil Service (Medical): No    Lack of Transportation (Non-Medical): No  Physical Activity: Insufficiently Active (12/24/2023)   Exercise Vital Sign    Days of Exercise per Week: 3 days    Minutes of Exercise per Session: 30 min  Stress: No Stress Concern Present (12/24/2023)   [employer] of Occupational Health - Occupational Stress Questionnaire    Feeling of Stress: Not at all  Social Connections: Socially Integrated (12/24/2023)   Social Connection and Isolation Panel    Frequency of Communication with Friends and Family: More than three times a week    Frequency of Social Gatherings with Friends and Family: More than three times a week    Attends Religious Services: More than 4 times per year    Active Member of [employer] or Organizations: Yes    Attends Hospital doctor: More than 4 times per year    Marital Status: Married    Tobacco Counseling Counseling given: Not Answered    Clinical Intake:  Pre-visit preparation completed: Yes  Pain : No/denies pain     BMI - recorded: 34.03 Nutritional Status: BMI > 30  Obese Nutritional Risks: None Diabetes: No  No results found for: HGBA1C   How often do you need to have someone help you when you read instructions, pamphlets, or other written materials from your doctor or pharmacy?: 1 - Never  Interpreter Needed?: No  Comments: lives with wife Information entered by :: B.Aayra Hornbaker,LPN  Activities of Daily Living     12/24/2023    2:06 PM  In your present state of health, do you have any difficulty performing the following activities:  Hearing? 0  Vision? 0  Difficulty concentrating or making decisions? 0  Walking or climbing stairs? 0  Dressing or bathing? 0  Doing errands, shopping? 0  Preparing Food and eating ? N  Using the Toilet? N  In the past six months, have you accidently leaked urine? N  Do you have problems with loss of bowel control? N  Managing your Medications? N  Managing your Finances? N  Housekeeping or managing your Housekeeping? N    Patient Care Team: Cleatus Arlyss RAMAN, MD as PCP - General (Family Medicine)  I have updated your Care Teams any recent Medical Services you may have received from other providers in the past year.     Assessment:   This is a routine wellness examination for [facility].  Hearing/Vision screen Hearing Screening - Comments:: Pt says his hearing is good Vision Screening - Comments:: Pt says is good w/glasses [facility]   Goals Addressed             This Visit's Progress    Patient Stated       12/24/23-I will maintain and continue medications as prescribed.      Patient Stated   On track    12/24/23- lose weight (wants to lose 30-40lb): under 200lbs       Depression Screen     12/24/2023    2:02 PM  02/13/2023   11:06 AM 12/28/2021   10:45 AM 10/13/2020    9:03 AM 11/09/2019    2:22 PM  PHQ 2/9 Scores  PHQ - 2 Score 0 0 0 0 0  PHQ- 9 Score  0  0     Fall Risk     12/24/2023    1:56 PM 02/13/2023   11:06 AM 12/28/2021   10:44 AM 10/13/2020    9:02 AM 11/09/2019    2:22 PM  Fall Risk   Falls in the past year? 0 0 0 0 1  Number falls in past yr: 0 0 0 0 1  Injury with Fall? 0 0 0 0   Risk for fall due to : No Fall Risks No Fall Risks Medication side effect Medication side effect   Follow up Falls prevention discussed;Education provided Falls evaluation completed Falls evaluation completed;Education provided;Falls prevention discussed  Falls evaluation completed;Falls prevention discussed       Data saved with a previous flowsheet row definition    MEDICARE RISK AT HOME:  Medicare Risk at Home Any stairs in or around the home?: Yes If so, are there any without handrails?: Yes Home free of loose throw rugs in walkways, pet beds, electrical cords, etc?: Yes Adequate lighting in your home to reduce risk of falls?: Yes Life alert?: No Use of a cane, walker or w/c?: No Grab bars in the bathroom?: Yes Shower chair or bench in shower?: No Elevated toilet seat or a handicapped toilet?: Yes  TIMED UP AND GO:  Was the test performed?  No  Cognitive Function: 6CIT completed    10/13/2020    9:07 AM  MMSE - Mini Mental State Exam  Orientation to time 5  Orientation to Place 5  Registration 3  Attention/ Calculation 5  Recall 3  Language- repeat 1        12/24/2023    2:08 PM 12/28/2021  10:45 AM  6CIT Screen  What Year? 0 points 0 points  What month? 0 points 0 points  What time? 0 points 0 points  Count back from 20 0 points 0 points  Months in reverse 0 points 0 points  Repeat phrase 0 points 2 points  Total Score 0 points 2 points    Immunizations Immunization History  Administered Date(s) Administered   Fluad Trivalent(High Dose 65+) 02/13/2023    Influenza,inj,Quad PF,6+ Mos 02/12/2018   Influenza-Unspecified 04/08/2017, 04/09/2022   Moderna Sars-Covid-2 Vaccination 07/07/2019, 08/04/2019, 06/05/2020   Pneumococcal Conjugate-13 01/26/2020   Tdap 01/26/2020   Zoster Recombinant(Shingrix) 02/22/2020, 03/27/2020    Screening Tests Health Maintenance  Topic Date Due   Pneumococcal Vaccine: 50+ Years (2 of 2 - PCV20 or PCV21) 01/25/2021   COVID-19 Vaccine (4 - 2024-25 season) 01/26/2023   INFLUENZA VACCINE  12/26/2023   Medicare Annual Wellness (AWV)  12/23/2024   Colonoscopy  06/14/2026   DTaP/Tdap/Td (2 - Td or Tdap) 01/25/2030   Hepatitis C Screening  Completed   Zoster Vaccines- Shingrix  Completed   Hepatitis B Vaccines  Aged Out   HPV VACCINES  Aged Out   Meningococcal B Vaccine  Aged Out    Health Maintenance  Health Maintenance Due  Topic Date Due   Pneumococcal Vaccine: 50+ Years (2 of 2 - PCV20 or PCV21) 01/25/2021   COVID-19 Vaccine (4 - 2024-25 season) 01/26/2023   Health Maintenance Items Addressed: None at this time  Additional Screening:  Vision Screening: Recommended annual ophthalmology exams for early detection of glaucoma and other disorders of the eye. Would you like a referral to an eye doctor? No    Dental Screening: Recommended annual dental exams for proper oral hygiene  Community Resource Referral / Chronic Care Management: CRR required this visit?  No   CCM required this visit?  Appt scheduled with PCP   Plan:    I have personally reviewed and noted the following in the patient's chart:   Medical and social history Use of alcohol, tobacco or illicit drugs  Current medications and supplements including opioid prescriptions. Patient is not currently taking opioid prescriptions. Functional ability and status Nutritional status Physical activity Advanced directives List of other physicians Hospitalizations, surgeries, and ER visits in previous 12 months Vitals Screenings to  include cognitive, depression, and falls Referrals and appointments  In addition, I have reviewed and discussed with patient certain preventive protocols, quality metrics, and best practice recommendations. A written personalized care plan for preventive services as well as general preventive health recommendations were provided to patient.   Erminio LITTIE Saris, LPN   2/69/7974   After Visit Summary: (MyChart) Due to this being a telephonic visit, the after visit summary with patients personalized plan was offered to patient via MyChart   Notes: Please refer to Routing Comments.  COMMENTS to PCP Hi! AWV done! Pt has CPE appt I made with you for September. Pt doing well but says he is getting up more at night for BR trips and concerned a little with it. Thank you!Erminio

## 2023-12-24 NOTE — Patient Instructions (Signed)
 Christopher Elliott , Thank you for taking time out of your busy schedule to complete your Annual Wellness Visit with me. I enjoyed our conversation and look forward to speaking with you again next year. I, as well as your care team,  appreciate your ongoing commitment to your health goals. Please review the following plan we discussed and let me know if I can assist you in the future. Your Game plan/ To Do List    Follow up Visits: Next Medicare AWV with our clinical staff: 12/24/24 @ 1:40pm televisit   Have you seen your provider in the last 6 months (3 months if uncontrolled diabetes)? No Next Office Visit with your provider: 02/16/24 physical  Clinician Recommendations:  Aim for 30 minutes of exercise or brisk walking, 6-8 glasses of water, and 5 servings of fruits and vegetables each day.       This is a list of the screening recommended for you and due dates:  Health Maintenance  Topic Date Due   Pneumococcal Vaccine for age over 40 (2 of 2 - PCV20 or PCV21) 01/25/2021   COVID-19 Vaccine (4 - 2024-25 season) 01/26/2023   Flu Shot  12/26/2023   Medicare Annual Wellness Visit  12/23/2024   Colon Cancer Screening  06/14/2026   DTaP/Tdap/Td vaccine (2 - Td or Tdap) 01/25/2030   Hepatitis C Screening  Completed   Zoster (Shingles) Vaccine  Completed   Hepatitis B Vaccine  Aged Out   HPV Vaccine  Aged Out   Meningitis B Vaccine  Aged Out    Advanced directives: (Copy Requested) Please bring a copy of your health care power of attorney and living will to the office to be added to your chart at your convenience. You can mail to [facility] [address] [place], [place] [address] or email to [email or web address] Advance Care Planning is important because it:  [x]  Makes sure you receive the medical care that is consistent with your values, goals, and preferences  [x]  It provides guidance to your family and loved ones and reduces their decisional burden about whether or not  they are making the right decisions based on your wishes.  Follow the link provided in your after visit summary or read over the paperwork we have mailed to you to help you started getting your Advance Directives in place. If you need assistance in completing these, please reach out to us  so that we can help you!

## 2024-02-15 ENCOUNTER — Encounter: Payer: Self-pay | Admitting: Family Medicine

## 2024-02-15 DIAGNOSIS — Z79899 Other long term (current) drug therapy: Secondary | ICD-10-CM | POA: Diagnosis not present

## 2024-02-15 DIAGNOSIS — I252 Old myocardial infarction: Secondary | ICD-10-CM | POA: Diagnosis not present

## 2024-02-15 DIAGNOSIS — K1120 Sialoadenitis, unspecified: Secondary | ICD-10-CM | POA: Diagnosis not present

## 2024-02-15 DIAGNOSIS — R220 Localized swelling, mass and lump, head: Secondary | ICD-10-CM | POA: Diagnosis not present

## 2024-02-15 DIAGNOSIS — M542 Cervicalgia: Secondary | ICD-10-CM | POA: Diagnosis not present

## 2024-02-15 DIAGNOSIS — E785 Hyperlipidemia, unspecified: Secondary | ICD-10-CM | POA: Diagnosis not present

## 2024-02-15 DIAGNOSIS — Z7901 Long term (current) use of anticoagulants: Secondary | ICD-10-CM | POA: Diagnosis not present

## 2024-02-15 DIAGNOSIS — I10 Essential (primary) hypertension: Secondary | ICD-10-CM | POA: Diagnosis not present

## 2024-02-16 ENCOUNTER — Encounter: Admitting: Family Medicine

## 2024-02-20 ENCOUNTER — Ambulatory Visit: Admitting: Family Medicine

## 2024-02-20 ENCOUNTER — Encounter: Payer: Self-pay | Admitting: Family Medicine

## 2024-02-20 VITALS — BP 138/78 | HR 83 | Temp 98.7°F | Ht 71.0 in | Wt 249.8 lb

## 2024-02-20 DIAGNOSIS — I480 Paroxysmal atrial fibrillation: Secondary | ICD-10-CM

## 2024-02-20 DIAGNOSIS — I10 Essential (primary) hypertension: Secondary | ICD-10-CM

## 2024-02-20 DIAGNOSIS — G4733 Obstructive sleep apnea (adult) (pediatric): Secondary | ICD-10-CM

## 2024-02-20 DIAGNOSIS — E785 Hyperlipidemia, unspecified: Secondary | ICD-10-CM

## 2024-02-20 DIAGNOSIS — R609 Edema, unspecified: Secondary | ICD-10-CM

## 2024-02-20 DIAGNOSIS — Z7189 Other specified counseling: Secondary | ICD-10-CM

## 2024-02-20 DIAGNOSIS — Z0000 Encounter for general adult medical examination without abnormal findings: Secondary | ICD-10-CM

## 2024-02-20 LAB — LIPID PANEL
Cholesterol: 99 mg/dL (ref 0–200)
HDL: 30.4 mg/dL — ABNORMAL LOW (ref >39.00)
LDL Cholesterol: 41 mg/dL (ref 0–99)
NonHDL: 68.87
Total CHOL/HDL Ratio: 3
Triglycerides: 141 mg/dL (ref 0.0–149.0)
VLDL: 28.2 mg/dL (ref 0.0–40.0)

## 2024-02-20 LAB — HEPATIC FUNCTION PANEL
ALT: 27 U/L (ref 0–53)
AST: 19 U/L (ref 0–37)
Albumin: 4.2 g/dL (ref 3.5–5.2)
Alkaline Phosphatase: 75 U/L (ref 39–117)
Bilirubin, Direct: 0.2 mg/dL (ref 0.0–0.3)
Total Bilirubin: 0.7 mg/dL (ref 0.2–1.2)
Total Protein: 7.1 g/dL (ref 6.0–8.3)

## 2024-02-20 LAB — TSH: TSH: 2.33 u[IU]/mL (ref 0.35–5.50)

## 2024-02-20 NOTE — Progress Notes (Addendum)
 He had L parotid swelling, with eval at outside clinic. Still with asymmetry but clearly improving.  He is eating lemon candy.  No fevers.  Taking clindamycin in the meantime.  Discussed parotid anatomy.  Elevated Cholesterol: Using medications without problems: yes Muscle aches: some prev L quad aches but better than prior.   Diet compliance: d/w pt.  Exercise: d/w pt.   Labs pending.   Hypertension:    Using medication without problems or lightheadedness: yes Chest pain with exertion:no Edema:no Short of breath:no  OSA on CPAP.  Sleeping well with use.  Waking with more energy.  Compliant.   PAF.  Anticoagulated.  He is going to see cardiology.  No bleeding.    He had his covid vaccine.  D/w pt. Flu deferred given parotid infection.  D/w pt 2025.   Tetanus 2021 Shingrix prev done.    PNA 2021 Wife designated if patient were incapacitated.   HIV and HCV prev done.   Nonsmoker, not due for AAA screening.   Colonoscopy 06/14/2021 (FINAL PATHOLOGIC DIAGNOSIS  Descending colon polyp, polypectomy:  Hyperplastic polyp.) Prostate cancer screening and PSA options (with potential risks and benefits of testing vs not testing) were discussed along with recent recs/guidelines.  He declined testing PSA at this point.  Meds, vitals, and allergies reviewed.   ROS: Per HPI unless specifically indicated in ROS section   GEN: nad, alert and oriented HEENT: mucous membranes moist, left parotid swelling noted but improved compared to the pictures he presented at the office visit.  No lip or tongue swelling.  No stridor. NECK: supple w/o LA CV: IRR not tachy PULM: ctab, no inc wob ABD: soft, +bs EXT: no edema SKIN: Well-perfused.

## 2024-02-20 NOTE — Patient Instructions (Addendum)
 Go to the lab on the way out.   If you have mychart we'll likely use that to update you.    Take care.  Glad to see you. Finish the antibiotics and update me as needed.  Flu shot when salivary gland is back to normal.

## 2024-02-22 ENCOUNTER — Encounter: Payer: Self-pay | Admitting: Family Medicine

## 2024-02-22 DIAGNOSIS — E785 Hyperlipidemia, unspecified: Secondary | ICD-10-CM | POA: Insufficient documentation

## 2024-02-22 DIAGNOSIS — R609 Edema, unspecified: Secondary | ICD-10-CM | POA: Insufficient documentation

## 2024-02-22 NOTE — Assessment & Plan Note (Signed)
 Anticoagulated.  He is going to see cardiology.  No bleeding.   Continue Eliquis  as is.

## 2024-02-22 NOTE — Assessment & Plan Note (Signed)
 See notes on labs.  Continue diltiazem  isosorbide and losartan.

## 2024-02-22 NOTE — Assessment & Plan Note (Signed)
See notes on labs.  Continue atorvastatin. 

## 2024-02-22 NOTE — Assessment & Plan Note (Signed)
 Continue CPAP.

## 2024-02-22 NOTE — Assessment & Plan Note (Signed)
 Improved.  Routine cautions given to patient.  No stridor.  No lip or tongue swelling.  Continue clindamycin and eating lemon candy.

## 2024-02-22 NOTE — Assessment & Plan Note (Signed)
 Wife designated if patient were incapacitated.

## 2024-02-22 NOTE — Assessment & Plan Note (Signed)
 He had his covid vaccine.  D/w pt. Flu deferred given parotid infection.  D/w pt 2025.   Tetanus 2021 Shingrix prev done.    PNA 2021 Wife designated if patient were incapacitated.   HIV and HCV prev done.   Nonsmoker, not due for AAA screening.   Colonoscopy 06/14/2021 (FINAL PATHOLOGIC DIAGNOSIS  Descending colon polyp, polypectomy:  Hyperplastic polyp.) Prostate cancer screening and PSA options (with potential risks and benefits of testing vs not testing) were discussed along with recent recs/guidelines.  He declined testing PSA at this point.

## 2024-02-24 ENCOUNTER — Ambulatory Visit: Payer: Self-pay | Admitting: Family Medicine

## 2024-03-10 DIAGNOSIS — G4733 Obstructive sleep apnea (adult) (pediatric): Secondary | ICD-10-CM | POA: Diagnosis not present

## 2024-04-01 ENCOUNTER — Encounter: Payer: Self-pay | Admitting: Family Medicine

## 2024-12-24 ENCOUNTER — Ambulatory Visit
